# Patient Record
Sex: Female | Born: 1965 | Race: Black or African American | Hispanic: No | Marital: Single | Smoking: Former smoker
Health system: Southern US, Community
[De-identification: ages and names within clinical notes are randomized; demographics above are authoritative.]

## PROBLEM LIST (undated history)

## (undated) DIAGNOSIS — D259 Leiomyoma of uterus, unspecified: Secondary | ICD-10-CM

## (undated) DIAGNOSIS — N938 Other specified abnormal uterine and vaginal bleeding: Secondary | ICD-10-CM

## (undated) DIAGNOSIS — N83202 Unspecified ovarian cyst, left side: Secondary | ICD-10-CM

## (undated) DIAGNOSIS — N83201 Unspecified ovarian cyst, right side: Secondary | ICD-10-CM

## (undated) HISTORY — DX: Other specified abnormal uterine and vaginal bleeding: N93.8

## (undated) HISTORY — DX: Leiomyoma of uterus, unspecified: D25.9

## (undated) HISTORY — DX: Unspecified ovarian cyst, right side: N83.202

## (undated) HISTORY — DX: Unspecified ovarian cyst, right side: N83.201

---

## 1999-01-14 HISTORY — PX: TUBAL LIGATION: SHX77

## 2006-05-28 ENCOUNTER — Ambulatory Visit: Payer: Self-pay | Admitting: Family Medicine

## 2006-07-28 ENCOUNTER — Ambulatory Visit (HOSPITAL_COMMUNITY): Admission: RE | Admit: 2006-07-28 | Discharge: 2006-07-28 | Payer: Self-pay | Admitting: Internal Medicine

## 2006-08-25 ENCOUNTER — Emergency Department (HOSPITAL_COMMUNITY): Admission: EM | Admit: 2006-08-25 | Discharge: 2006-08-25 | Payer: Self-pay | Admitting: Emergency Medicine

## 2007-03-02 ENCOUNTER — Encounter: Admission: RE | Admit: 2007-03-02 | Discharge: 2007-03-02 | Payer: Self-pay | Admitting: Internal Medicine

## 2007-03-30 ENCOUNTER — Encounter: Admission: RE | Admit: 2007-03-30 | Discharge: 2007-03-30 | Payer: Self-pay | Admitting: Internal Medicine

## 2007-09-09 ENCOUNTER — Emergency Department (HOSPITAL_COMMUNITY): Admission: EM | Admit: 2007-09-09 | Discharge: 2007-09-09 | Payer: Self-pay | Admitting: Emergency Medicine

## 2008-03-31 ENCOUNTER — Encounter: Admission: RE | Admit: 2008-03-31 | Discharge: 2008-03-31 | Payer: Self-pay | Admitting: Internal Medicine

## 2008-10-31 ENCOUNTER — Ambulatory Visit (HOSPITAL_COMMUNITY): Admission: RE | Admit: 2008-10-31 | Discharge: 2008-10-31 | Payer: Self-pay | Admitting: Obstetrics

## 2009-03-22 ENCOUNTER — Emergency Department (HOSPITAL_COMMUNITY): Admission: EM | Admit: 2009-03-22 | Discharge: 2009-03-22 | Payer: Self-pay | Admitting: Emergency Medicine

## 2009-07-29 ENCOUNTER — Emergency Department (HOSPITAL_COMMUNITY): Admission: EM | Admit: 2009-07-29 | Discharge: 2009-07-29 | Payer: Self-pay | Admitting: Emergency Medicine

## 2009-10-24 ENCOUNTER — Ambulatory Visit: Payer: Self-pay | Admitting: Obstetrics & Gynecology

## 2009-10-24 ENCOUNTER — Other Ambulatory Visit: Admission: RE | Admit: 2009-10-24 | Discharge: 2009-10-24 | Payer: Self-pay | Admitting: Obstetrics & Gynecology

## 2009-10-24 LAB — CONVERTED CEMR LAB
MCHC: 34.8 g/dL (ref 30.0–36.0)
MCV: 76.1 fL — ABNORMAL LOW (ref 78.0–100.0)
Platelets: 137 10*3/uL — ABNORMAL LOW (ref 150–400)
TSH: 2.427 microintl units/mL (ref 0.350–4.500)

## 2009-10-29 ENCOUNTER — Ambulatory Visit (HOSPITAL_COMMUNITY): Admission: RE | Admit: 2009-10-29 | Discharge: 2009-10-29 | Payer: Self-pay | Admitting: Family Medicine

## 2010-03-28 LAB — POCT PREGNANCY, URINE: Preg Test, Ur: NEGATIVE

## 2010-03-30 LAB — WET PREP, GENITAL
Clue Cells Wet Prep HPF POC: NONE SEEN
Trich, Wet Prep: NONE SEEN

## 2010-03-30 LAB — URINALYSIS, ROUTINE W REFLEX MICROSCOPIC
Bilirubin Urine: NEGATIVE
Ketones, ur: NEGATIVE mg/dL
Protein, ur: NEGATIVE mg/dL
Urobilinogen, UA: 0.2 mg/dL (ref 0.0–1.0)

## 2010-03-30 LAB — POCT PREGNANCY, URINE: Preg Test, Ur: NEGATIVE

## 2010-04-16 ENCOUNTER — Other Ambulatory Visit (HOSPITAL_COMMUNITY): Payer: Self-pay | Admitting: Internal Medicine

## 2010-04-16 DIAGNOSIS — Z1231 Encounter for screening mammogram for malignant neoplasm of breast: Secondary | ICD-10-CM

## 2010-04-26 ENCOUNTER — Ambulatory Visit (HOSPITAL_COMMUNITY)
Admission: RE | Admit: 2010-04-26 | Discharge: 2010-04-26 | Disposition: A | Discharge status: Home / Self Care | Payer: Self-pay | Source: Ambulatory Visit | Attending: Internal Medicine | Admitting: Internal Medicine

## 2010-04-26 DIAGNOSIS — Z1231 Encounter for screening mammogram for malignant neoplasm of breast: Secondary | ICD-10-CM

## 2010-09-14 ENCOUNTER — Emergency Department (HOSPITAL_COMMUNITY)
Admission: EM | Admit: 2010-09-14 | Discharge: 2010-09-14 | Disposition: A | Discharge status: Home / Self Care | Payer: Self-pay | Attending: Emergency Medicine | Admitting: Emergency Medicine

## 2010-09-14 ENCOUNTER — Emergency Department (HOSPITAL_COMMUNITY): Payer: Self-pay

## 2010-09-14 DIAGNOSIS — R51 Headache: Secondary | ICD-10-CM | POA: Insufficient documentation

## 2010-09-14 DIAGNOSIS — H9209 Otalgia, unspecified ear: Secondary | ICD-10-CM | POA: Insufficient documentation

## 2010-11-27 ENCOUNTER — Encounter: Payer: Self-pay | Admitting: *Deleted

## 2010-11-29 ENCOUNTER — Ambulatory Visit: Payer: Self-pay | Admitting: Advanced Practice Midwife

## 2011-01-02 ENCOUNTER — Ambulatory Visit: Payer: Self-pay | Admitting: Advanced Practice Midwife

## 2011-01-29 ENCOUNTER — Encounter: Payer: Self-pay | Admitting: Advanced Practice Midwife

## 2011-01-29 ENCOUNTER — Ambulatory Visit (INDEPENDENT_AMBULATORY_CARE_PROVIDER_SITE_OTHER): Payer: Self-pay | Admitting: Advanced Practice Midwife

## 2011-01-29 VITALS — BP 143/97 | HR 65 | Temp 97.3°F | Resp 20 | Ht 66.0 in | Wt 172.3 lb

## 2011-01-29 DIAGNOSIS — I10 Essential (primary) hypertension: Secondary | ICD-10-CM

## 2011-01-29 DIAGNOSIS — Z01419 Encounter for gynecological examination (general) (routine) without abnormal findings: Secondary | ICD-10-CM

## 2011-01-29 DIAGNOSIS — Z23 Encounter for immunization: Secondary | ICD-10-CM

## 2011-01-29 MED ORDER — INFLUENZA VIRUS VACC SPLIT PF IM SUSP
0.5000 mL | INTRAMUSCULAR | Status: AC | PRN
  Administered 2011-01-29: 0.5 mL via INTRAMUSCULAR

## 2011-01-29 MED ORDER — HYDROCHLOROTHIAZIDE 12.5 MG PO TABS: 25.0000 mg | ORAL_TABLET | Freq: Every day | ORAL | Status: DC

## 2011-01-29 NOTE — Progress Notes (Signed)
  Subjective:     Erin Mcdaniel is a 46 y.o. female and is here for a comprehensive physical exam. The patient reports occasional headaches and high blood pressure readings.  .  History   Social History  . Marital Status: Single    Spouse Name: N/A    Number of Children: N/A  . Years of Education: N/A   Occupational History  . Not on file.   Social History Main Topics  . Smoking status: Former Smoker    Types: Cigarettes    Quit date: 01/28/1997  . Smokeless tobacco: Not on file  . Alcohol Use: Yes     social  . Drug Use: No  . Sexually Active: No   Other Topics Concern  . Not on file   Social History Narrative  . No narrative on file   No health maintenance topics applied.  The following portions of the patient's history were reviewed and updated as appropriate: allergies, current medications, past medical history, past social history, past surgical history and problem list.  Review of Systems Constitutional: negative for chills, fatigue and fevers Respiratory: negative for cough and shortness of breath Cardiovascular: negative for chest pain, chest pressure/discomfort and claudication Gastrointestinal: negative for abdominal pain, constipation, diarrhea, melena and vomiting Genitourinary:negative for dysuria, frequency and hematuria Neurological: negative for coordination problems and dizziness.  Positive for headaches.     Objective:    BP 143/97  Pulse 65  Temp(Src) 97.3 F (36.3 C) (Oral)  Resp 20  Ht 5\' 6"  (1.676 m)  Wt 172 lb 4.8 oz (78.155 kg)  BMI 27.81 kg/m2  LMP 01/13/2011 General appearance: alert, cooperative and no distress Head: Normocephalic, without obvious abnormality, atraumatic Neck: no adenopathy, supple, symmetrical, trachea midline and thyroid not enlarged, symmetric, no tenderness/mass/nodules Lungs: clear to auscultation bilaterally Breasts: normal appearance, no masses or tenderness Heart: regular rate and rhythm, S1, S2 normal, no  murmur, click, rub or gallop Abdomen: soft, non-tender; bowel sounds normal; no masses,  no organomegaly Pelvic: cervix normal in appearance, external genitalia normal, no adnexal masses or tenderness, no cervical motion tenderness, uterus normal size, shape, and consistency and vagina normal without discharge Pulses: 2+ and symmetric     Assessment:    Healthy female exam.  Elevated blood pressure     Plan:    1) Elevated blood pressure noted on vital signs.  Recommended an appointment with [facility].  Rx given to the patient for HCTZ and education provided on diet and exercise.    See After Visit Summary for Counseling Recommendations

## 2011-01-29 NOTE — Patient Instructions (Addendum)
Hypertension As your heart beats, it forces blood through your arteries. This force is your blood pressure. If the pressure is too high, it is called hypertension (HTN) or high blood pressure. HTN is dangerous because you may have it and not know it. High blood pressure may mean that your heart has to work harder to pump blood. Your arteries may be narrow or stiff. The extra work puts you at risk for heart disease, stroke, and other problems.  Blood pressure consists of two numbers, a higher number over a lower, 110/72, for example. It is stated as "110 over 72." The ideal is below 120 for the top number (systolic) and under 80 for the bottom (diastolic). Write down your blood pressure today. You should pay close attention to your blood pressure if you have certain conditions such as:  Heart failure.   Prior heart attack.   Diabetes   Chronic kidney disease.   Prior stroke.   Multiple risk factors for heart disease.  To see if you have HTN, your blood pressure should be measured while you are seated with your arm held at the level of the heart. It should be measured at least twice. A one-time elevated blood pressure reading (especially in the Emergency Department) does not mean that you need treatment. There may be conditions in which the blood pressure is different between your right and left arms. It is important to see your caregiver soon for a recheck. Most people have essential hypertension which means that there is not a specific cause. This type of high blood pressure may be lowered by changing lifestyle factors such as:  Stress.   Smoking.   Lack of exercise.   Excessive weight.   Drug/tobacco/alcohol use.   Eating less salt.  Most people do not have symptoms from high blood pressure until it has caused damage to the body. Effective treatment can often prevent, delay or reduce that damage. TREATMENT  When a cause has been identified, treatment for high blood pressure is  directed at the cause. There are a large number of medications to treat HTN. These fall into several categories, and your caregiver will help you select the medicines that are best for you. Medications may have side effects. You should review side effects with your caregiver. If your blood pressure stays high after you have made lifestyle changes or started on medicines,   Your medication(s) may need to be changed.   Other problems may need to be addressed.   Be certain you understand your prescriptions, and know how and when to take your medicine.   Be sure to follow up with your caregiver within the time frame advised (usually within two weeks) to have your blood pressure rechecked and to review your medications.   If you are taking more than one medicine to lower your blood pressure, make sure you know how and at what times they should be taken. Taking two medicines at the same time can result in blood pressure that is too low.  SEEK IMMEDIATE MEDICAL CARE IF:  You develop a severe headache, blurred or changing vision, or confusion.   You have unusual weakness or numbness, or a faint feeling.   You have severe chest or abdominal pain, vomiting, or breathing problems.  MAKE SURE YOU:   Understand these instructions.   Will watch your condition.   Will get help right away if you are not doing well or get worse.  Document Released: 12/30/2004 Document Revised: 09/11/2010 Document Reviewed:  08/20/2007 [employer] Patient Information 2012 [place], [place].Place annual gynecologic exam patient instructions here.   Health Maintenance, Females A healthy lifestyle and preventative care can promote health and wellness.  Maintain regular health, dental, and eye exams.   Eat a healthy diet. Foods like vegetables, fruits, whole grains, low-fat dairy products, and lean protein foods contain the nutrients you need without too many calories. Decrease your intake of foods high in solid fats, added  sugars, and salt. Get information about a proper diet from your caregiver, if necessary.   Regular physical exercise is one of the most important things you can do for your health. Most adults should get at least 150 minutes of moderate-intensity exercise (any activity that increases your heart rate and causes you to sweat) each week. In addition, most adults need muscle-strengthening exercises on 2 or more days a week.    Maintain a healthy weight. The body mass index (BMI) is a screening tool to identify possible weight problems. It provides an estimate of body fat based on height and weight. Your caregiver can help determine your BMI, and can help you achieve or maintain a healthy weight. For adults 20 years and older:   A BMI below 18.5 is considered underweight.   A BMI of 18.5 to 24.9 is normal.   A BMI of 25 to 29.9 is considered overweight.   A BMI of 30 and above is considered obese.   Maintain normal blood lipids and cholesterol by exercising and minimizing your intake of saturated fat. Eat a balanced diet with plenty of fruits and vegetables. Blood tests for lipids and cholesterol should begin at age 87 and be repeated every 5 years. If your lipid or cholesterol levels are high, you are over 50, or you are a high risk for heart disease, you may need your cholesterol levels checked more frequently.Ongoing high lipid and cholesterol levels should be treated with medicines if diet and exercise are not effective.   If you smoke, find out from your caregiver how to quit. If you do not use tobacco, do not start.   If you are pregnant, do not drink alcohol. If you are breastfeeding, be very cautious about drinking alcohol. If you are not pregnant and choose to drink alcohol, do not exceed 1 drink per day. One drink is considered to be 12 ounces (355 mL) of beer, 5 ounces (148 mL) of wine, or 1.5 ounces (44 mL) of liquor.   Avoid use of street drugs. Do not share needles with anyone. Ask for  help if you need support or instructions about stopping the use of drugs.   High blood pressure causes heart disease and increases the risk of stroke. Blood pressure should be checked at least every 1 to 2 years. Ongoing high blood pressure should be treated with medicines, if weight loss and exercise are not effective.   If you are 52 to 44 years old, ask your caregiver if you should take aspirin to prevent strokes.   Diabetes screening involves taking a blood sample to check your fasting blood sugar level. This should be done once every 3 years, after age 47, if you are within normal weight and without risk factors for diabetes. Testing should be considered at a younger age or be carried out more frequently if you are overweight and have at least 1 risk factor for diabetes.   Breast cancer screening is essential preventative care for women. You should practice "breast self-awareness." This means understanding the normal appearance  and feel of your breasts and may include breast self-examination. Any changes detected, no matter how small, should be reported to a caregiver. Women in their 31s and 30s should have a clinical breast exam (CBE) by a caregiver as part of a regular health exam every 1 to 3 years. After age 74, women should have a CBE every year. Starting at age 57, women should consider having a mammogram (breast X-ray) every year. Women who have a family history of breast cancer should talk to their caregiver about genetic screening. Women at a high risk of breast cancer should talk to their caregiver about having an MRI and a mammogram every year.   The Pap test is a screening test for cervical cancer. Women should have a Pap test starting at age 6. Between ages 108 and 61, Pap tests should be repeated every 2 years. Beginning at age 46, you should have a Pap test every 3 years as long as the past 3 Pap tests have been normal. If you had a hysterectomy for a problem that was not cancer or a  condition that could lead to cancer, then you no longer need Pap tests. If you are between ages 60 and 72, and you have had normal Pap tests going back 10 years, you no longer need Pap tests. If you have had past treatment for cervical cancer or a condition that could lead to cancer, you need Pap tests and screening for cancer for at least 20 years after your treatment. If Pap tests have been discontinued, risk factors (such as a new sexual partner) need to be reassessed to determine if screening should be resumed. Some women have medical problems that increase the chance of getting cervical cancer. In these cases, your caregiver may recommend more frequent screening and Pap tests.   The human papillomavirus (HPV) test is an additional test that may be used for cervical cancer screening. The HPV test looks for the virus that can cause the cell changes on the cervix. The cells collected during the Pap test can be tested for HPV. The HPV test could be used to screen women aged 38 years and older, and should be used in women of any age who have unclear Pap test results. After the age of 24, women should have HPV testing at the same frequency as a Pap test.   Colorectal cancer can be detected and often prevented. Most routine colorectal cancer screening begins at the age of 31 and continues through age 43. However, your caregiver may recommend screening at an earlier age if you have risk factors for colon cancer. On a yearly basis, your caregiver may provide home test kits to check for hidden blood in the stool. Use of a small camera at the end of a tube, to directly examine the colon (sigmoidoscopy or colonoscopy), can detect the earliest forms of colorectal cancer. Talk to your caregiver about this at age 48, when routine screening begins. Direct examination of the colon should be repeated every 5 to 10 years through age 63, unless early forms of pre-cancerous polyps or small growths are found.   Practice safe  sex. Use condoms and avoid high-risk sexual practices to reduce the spread of sexually transmitted infections (STIs). Sexually active women aged 74 and younger should be checked for Chlamydia, which is a common sexually transmitted infection. Older women with new or multiple partners should also be tested for Chlamydia. Testing for other STIs is recommended if you are sexually active and  at increased risk.   Osteoporosis is a disease in which the bones lose minerals and strength with aging. This can result in serious bone fractures. The risk of osteoporosis can be identified using a bone density scan. Women ages 28 and over and women at risk for fractures or osteoporosis should discuss screening with their caregivers. Ask your caregiver whether you should be taking a calcium supplement or vitamin D to reduce the rate of osteoporosis.   Menopause can be associated with physical symptoms and risks. Hormone replacement therapy is available to decrease symptoms and risks. You should talk to your caregiver about whether hormone replacement therapy is right for you.   Use sunscreen with a sun protection factor (SPF) of 30 or greater. Apply sunscreen liberally and repeatedly throughout the day. You should seek shade when your shadow is shorter than you. Protect yourself by wearing long sleeves, pants, a wide-brimmed hat, and sunglasses year round, whenever you are outdoors.   Notify your caregiver of new moles or changes in moles, especially if there is a change in shape or color. Also notify your caregiver if a mole is larger than the size of a pencil eraser.   Stay current with your immunizations.  Document Released: 07/15/2010 Document Revised: 09/11/2010 Document Reviewed: 07/15/2010 [facility] Patient Information 2012 [place], [place].

## 2011-01-30 ENCOUNTER — Telehealth: Payer: Self-pay | Admitting: *Deleted

## 2011-01-30 NOTE — Telephone Encounter (Signed)
I returned pt call and explained that she will need to call [facility] and go through their qualification process. I gave her the number to call. Pt voiced understanding.

## 2011-01-30 NOTE — Telephone Encounter (Signed)
Pt left message stating that she was told by the doctor that she would get a referral to [facility]. She did not get the referral before leaving clinic. Please call

## 2011-03-04 ENCOUNTER — Other Ambulatory Visit (HOSPITAL_COMMUNITY): Payer: Self-pay | Admitting: Internal Medicine

## 2011-03-04 DIAGNOSIS — Z1231 Encounter for screening mammogram for malignant neoplasm of breast: Secondary | ICD-10-CM

## 2011-03-28 ENCOUNTER — Emergency Department (HOSPITAL_COMMUNITY): Payer: Self-pay

## 2011-03-28 ENCOUNTER — Emergency Department (HOSPITAL_COMMUNITY)
Admission: EM | Admit: 2011-03-28 | Discharge: 2011-03-28 | Disposition: A | Discharge status: Home / Self Care | Payer: Self-pay | Attending: Emergency Medicine | Admitting: Emergency Medicine

## 2011-03-28 ENCOUNTER — Encounter (HOSPITAL_COMMUNITY): Payer: Self-pay | Admitting: Emergency Medicine

## 2011-03-28 DIAGNOSIS — X58XXXA Exposure to other specified factors, initial encounter: Secondary | ICD-10-CM | POA: Insufficient documentation

## 2011-03-28 DIAGNOSIS — S90859A Superficial foreign body, unspecified foot, initial encounter: Secondary | ICD-10-CM

## 2011-03-28 DIAGNOSIS — M79609 Pain in unspecified limb: Secondary | ICD-10-CM | POA: Insufficient documentation

## 2011-03-28 DIAGNOSIS — IMO0002 Reserved for concepts with insufficient information to code with codable children: Secondary | ICD-10-CM | POA: Insufficient documentation

## 2011-03-28 MED ORDER — CEPHALEXIN 500 MG PO CAPS: 500.0000 mg | ORAL_CAPSULE | Freq: Four times a day (QID) | ORAL | Status: DC

## 2011-03-28 MED ORDER — TETANUS-DIPHTH-ACELL PERTUSSIS 5-2.5-18.5 LF-MCG/0.5 IM SUSP
0.5000 mL | Freq: Once | INTRAMUSCULAR | Status: AC
  Administered 2011-03-28: 0.5 mL via INTRAMUSCULAR
  Filled 2011-03-28: qty 0.5

## 2011-03-28 NOTE — ED Provider Notes (Signed)
Medical screening examination/treatment/procedure(s) were performed by non-physician practitioner and as supervising physician I was immediately available for consultation/collaboration.   Morrell Fluke, MD 03/28/11 2259 

## 2011-03-28 NOTE — Progress Notes (Signed)
ED CM contacted by ED PA/NP to assist with medications but pt states she is able to afford $4 keflex from [facility].  Pt inquired about assistance for funding with ortho appt.  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  Pt informed that [facility] does not have funding to assist with copay or bill for ortho appt. Pt referred to the billing office of the referred orthopedic provider for assistance.  Pt voiced understanding.

## 2011-03-28 NOTE — ED Provider Notes (Signed)
History     CSN: [id number]  Arrival date & time 03/28/11  1549   First MD Initiated Contact with Patient 03/28/11 1610      Chief Complaint  Patient presents with  . Foot Problem    wound to bottom of LT foot x 2days.    (Consider location/radiation/quality/duration/timing/severity/associated sxs/prior treatment) HPI Comments: Patient presents emergency Department chief complain of left foot pain.  Patient states that 2 days ago she may have stepped on something in that she has a wound on the bottom of her left foot. She reports mild pain with weight bearing & some clear draining from wound. She denies fevers, night sweats, chills, mucous drainage, surrounding erythema, streaking up her leg, or extremity edema.   The history is provided by the patient.    Past Medical History  Diagnosis Date  . DUB (dysfunctional uterine bleeding)   . Bilateral ovarian cysts   . Fibroid, uterus     Past Surgical History  Procedure Date  . Tubal ligation 2001    Family History  Problem Relation Age of Onset  . Diabetes Mother   . Heart disease Mother   . Hypertension Mother   . Cancer Maternal Aunt   . Hyperthyroidism Brother   . Hypertension Brother     History  Substance Use Topics  . Smoking status: Former Smoker    Types: Cigarettes    Quit date: 01/28/1997  . Smokeless tobacco: Not on file  . Alcohol Use: Yes     social    OB History    Grav Para Term Preterm Abortions TAB SAB Ect Mult Living   2 2 2       2       Review of Systems  Constitutional: Negative for fever, chills and appetite change.  HENT: Negative for congestion.   Eyes: Negative for visual disturbance.  Respiratory: Negative for shortness of breath.   Cardiovascular: Negative for chest pain and leg swelling.  Gastrointestinal: Negative for abdominal pain.  Genitourinary: Negative for dysuria, urgency and frequency.  Skin: Positive for wound. Negative for color change and rash.  Neurological:  Negative for dizziness, syncope, weakness, light-headedness, numbness and headaches.  Psychiatric/Behavioral: Negative for confusion.  All other systems reviewed and are negative.    Allergies  Review of patient's allergies indicates no known allergies.  Home Medications   Current Outpatient Rx  Name Route Sig Dispense Refill  . IBUPROFEN 200 MG PO TABS Oral Take 200 mg by mouth every 6 (six) hours as needed. pain      BP 129/91  Pulse 64  Temp 98.7 F (37.1 C)  Resp 18  Ht 5\' 6"  (1.676 m)  Wt 170 lb (77.111 kg)  BMI 27.44 kg/m2  SpO2 100%  LMP 03/28/2011  Physical Exam  Nursing note and vitals reviewed. Constitutional: She is oriented to person, place, and time. She appears well-developed and well-nourished. No distress.  HENT:  Head: Normocephalic and atraumatic.  Eyes: Conjunctivae and EOM are normal.  Neck: Normal range of motion.  Pulmonary/Chest: Effort normal.  Musculoskeletal: Normal range of motion.       Left foot: She exhibits tenderness. She exhibits no bony tenderness, no swelling and normal capillary refill.       Small puncture-type wound located on the plantar surface of patient's left foot.  Wound is not currently draining, no surrounding erythema, and no swelling to the area.  Mild tenderness on palpation.  The patient is able to and really without difficulty.  Neurological: She is alert and oriented to person, place, and time.  Skin: Skin is warm and dry. No rash noted. She is not diaphoretic.  Psychiatric: She has a normal mood and affect. Her behavior is normal.    ED Course  Procedures (including critical care time)  Labs Reviewed - No data to display Dg Foot Complete Left  03/28/2011  *RADIOLOGY REPORT*  Clinical Data: Nonhealing puncture wound involving the plantar surface of the foot.  History of stepping on something 2 months previously.  LEFT FOOT - COMPLETE 3+ VIEW  Comparison: None.  Findings: There is a small opaque density projecting  between the distal diaphyses of the fourth and fifth metatarsals on the oblique view and between the third and fourth metatarsals on the frontal view.  This may reflect a small opaque foreign body within the soft tissues.  The density cannot be definitely visualized on the lateral image.  No fracture or bony destruction is seen.  There is minimal spurring of the distal dorsal aspect of the first metatarsal.  There is calcification in the distal Achilles tendon area.  IMPRESSION: Small opaque density projecting between the distal diaphyses of the fourth and fifth metatarsals on the oblique view and between the third and fourth metatarsals on the frontal view.  This may reflect a small opaque foreign body within the soft tissues.  The density cannot be definitely visualized on the lateral image.  Original Report Authenticated By: Crawford Givens, M.D.     No diagnosis found.    MDM  Foot pain, question foreign body  Tdap booster given.  No bone or joint penetration, & possible foreign body present, but can not be seen or extracted on exam- unable to determine depth of ?FB on xray. Local care discussed and abx prescribed. Pt is to call orthopedics Monday morning to schedule a f-u apt. Note there is no evidence of infection at this time. Pt has been advised that left foot should be wt bearing as tolerated, elevated when possible & followed very closely for signs of infection. Pt verbalizes understanding and is agreeable with plan. Case discussed w Dr. Ranae Palms who agrees w above plan.          Jaci Carrel, [place] 03/28/11 1812

## 2011-03-28 NOTE — Discharge Instructions (Signed)
Call the orthopedic office first thing Monday morning for further evaluation of your foot.  Take your full course of antibiotics as prescribed.  Be sure to clean your wound 2 times daily with soap and water.  Keep the extremity elevated when resting.

## 2011-03-28 NOTE — ED Notes (Signed)
Pt with small ulceration (less than pencil eraser) to left base of foot, had "pus and blood" that "came out of it", now covered by pt in bandaid. Not sure of last tetanus shot. Not sure of injury or not.

## 2011-04-08 ENCOUNTER — Encounter (HOSPITAL_COMMUNITY): Payer: Self-pay | Admitting: *Deleted

## 2011-04-08 ENCOUNTER — Emergency Department (HOSPITAL_COMMUNITY)
Admission: EM | Admit: 2011-04-08 | Discharge: 2011-04-08 | Disposition: A | Discharge status: Home / Self Care | Payer: Self-pay | Attending: Emergency Medicine | Admitting: Emergency Medicine

## 2011-04-08 DIAGNOSIS — M795 Residual foreign body in soft tissue: Secondary | ICD-10-CM | POA: Insufficient documentation

## 2011-04-08 MED ORDER — BACITRACIN-NEOMYCIN-POLYMYXIN OINTMENT TUBE
TOPICAL_OINTMENT | Freq: Once | CUTANEOUS | Status: DC
  Filled 2011-04-08: qty 15

## 2011-04-08 MED ORDER — BUPIVACAINE HCL (PF) 0.5 % IJ SOLN
10.0000 mL | Freq: Once | INTRAMUSCULAR | Status: DC
  Filled 2011-04-08: qty 30

## 2011-04-08 MED ORDER — SODIUM BICARBONATE 8.4 % IV SOLN
50.0000 meq | Freq: Once | INTRAVENOUS | Status: DC
  Filled 2011-04-08: qty 50

## 2011-04-08 NOTE — ED Notes (Signed)
Pt sts was seen here two weeks ago for small ulcer like sore on sole of L foot. Xray showed a foreign object, unable to tell what. Pt sts she began trying to get object out, saw that it was glass. Unable to remove object, made pain worse.

## 2011-04-08 NOTE — Discharge Instructions (Signed)
Foreign Body When the skin is cut or punctured and some object is left in the tissue under the skin, that object is called a "foreign body". A foreign body could be a wood splinter, a thorn, a sliver of metal, a shard of glass, a cactus needle or the tip of a pencil. In most instances, your caregiver will recommend that the foreign body be removed. If it is not removed, infection, abscess formation, an allergic reaction, chronic pain and disability can occur over time. Sometimes, foreign bodies (particularly very small ones) can be difficult to locate. Your caregiver may recommend x-rays or ultrasound imaging to help find them. If removal is not successful, there may be a need to see a surgeon who might suggest further exploration in the operating room. Occasionally, tiny bits of metallic foreign material (such as shrapnel) are not removed, if it is felt that there would be no harm in leaving them untouched. HOME CARE INSTRUCTIONS  Rest the injured area and keep it elevated until all the pain and swelling are gone.   You will need a tetanus vaccination if you have not had one in the last 5 years.   Return to this facility, see your caregiver or follow-up as instructed in 2 days.  SEEK IMMEDIATE MEDICAL CARE IF:   You develop increasing redness or swelling of the skin near the wound.   You develop drainage of pus from the wound.   You have persistent pain or loss of motion.   You have red streaks extending above or below the wound location.  MAKE SURE YOU:   Understand these instructions.   Will watch your condition.   Will get help right away if you are not doing well or get worse.  Document Released: 12/30/2004 Document Revised: 12/19/2010 Document Reviewed: 12/01/2008 [facility] Patient Information 2012 [place], [place]. Discussed a small piece of glass was removed from your foot I would like you to soak her foot, 2 or 3 times a day for the next 2 days in a Epsom salts or warm salt water  bath

## 2011-04-08 NOTE — ED Provider Notes (Signed)
History     CSN: [id number]  Arrival date & time 04/08/11  1600   First MD Initiated Contact with Patient 04/08/11 1837      Chief Complaint  Patient presents with  . Foot Injury    (Consider location/radiation/quality/duration/timing/severity/associated sxs/prior treatment) HPI Comments: Patient was seen 2 weeks ago for foreign body or left.  She was placed on antibiotics.  It is persistently gotten worse and pain.  No inflammation.  No drainage.  She has been digging at it with a pin now.  It is more painful  Patient is a 46 y.o. female presenting with foot injury.  Foot Injury  The incident occurred more than 1 week ago.    Past Medical History  Diagnosis Date  . DUB (dysfunctional uterine bleeding)   . Bilateral ovarian cysts   . Fibroid, uterus     Past Surgical History  Procedure Date  . Tubal ligation 2001    Family History  Problem Relation Age of Onset  . Diabetes Mother   . Heart disease Mother   . Hypertension Mother   . Cancer Maternal Aunt   . Hyperthyroidism Brother   . Hypertension Brother     History  Substance Use Topics  . Smoking status: Former Smoker    Types: Cigarettes    Quit date: 01/28/1997  . Smokeless tobacco: Not on file  . Alcohol Use: Yes     social    OB History    Grav Para Term Preterm Abortions TAB SAB Ect Mult Living   2 2 2       2       Review of Systems  Skin: Positive for wound.    Allergies  Review of patient's allergies indicates no known allergies.  Home Medications   Current Outpatient Rx  Name Route Sig Dispense Refill  . IBUPROFEN 200 MG PO TABS Oral Take 600 mg by mouth every 8 (eight) hours as needed. pain    . CEPHALEXIN 500 MG PO CAPS Oral Take 500 mg by mouth 4 (four) times daily.      BP 137/87  Pulse 84  Temp(Src) 98.5 F (36.9 C) (Oral)  Resp 18  SpO2 99%  LMP 03/28/2011  Physical Exam  Constitutional: She is oriented to person, place, and time. She appears well-developed and  well-nourished.  HENT:  Head: Normocephalic.  Neck: Normal range of motion.  Cardiovascular: Normal rate.   Pulmonary/Chest: Effort normal.  Musculoskeletal:       Are muddy and bottom of the left foot, between the fourth and fifth metatarsals  Neurological: She is alert and oriented to person, place, and time.  Skin:       No inflammatory process around puncture site    ED Course  FOREIGN BODY REMOVAL Performed by: Arman Filter Authorized by: Earley Favor K FOREIGN BODY REMOVAL Date/Time: 04/08/2011 7:56 PM Performed by: Arman Filter Authorized by: Arman Filter Consent: Verbal consent obtained. Risks and benefits: risks, benefits and alternatives were discussed Consent given by: patient Required items: required blood products, implants, devices, and special equipment available Patient identity confirmed: verbally with patient Body area: skin Anesthesia: local infiltration Local anesthetic: bupivacaine 0.5% without epinephrine Anesthetic total: 2 ml Patient sedated: no Patient restrained: no Localization method: visualized Dressing: antibiotic ointment Tendon involvement: none Depth: deep Complexity: simple 1 objects recovered. Post-procedure assessment: foreign body removed Patient tolerance: Patient tolerated the procedure well with no immediate complications.   (including critical care time)  Labs Reviewed -  No data to display No results found.   1. Foreign body (FB) in soft tissue       MDM  Foreign body, right foot        Arman Filter, NP 04/08/11 2000  Arman Filter, NP 04/08/11 2000

## 2011-04-08 NOTE — ED Provider Notes (Signed)
Medical screening examination/treatment/procedure(s) were performed by non-physician practitioner and as supervising physician I was immediately available for consultation/collaboration.  Raeford Razor, MD 04/08/11 [phone]

## 2011-04-28 ENCOUNTER — Ambulatory Visit (HOSPITAL_COMMUNITY)
Admission: RE | Admit: 2011-04-28 | Discharge: 2011-04-28 | Disposition: A | Discharge status: Home / Self Care | Payer: Self-pay | Source: Ambulatory Visit | Attending: Internal Medicine | Admitting: Internal Medicine

## 2011-04-28 ENCOUNTER — Ambulatory Visit (HOSPITAL_COMMUNITY): Payer: Self-pay

## 2011-04-28 DIAGNOSIS — Z1231 Encounter for screening mammogram for malignant neoplasm of breast: Secondary | ICD-10-CM

## 2011-05-07 ENCOUNTER — Emergency Department (HOSPITAL_COMMUNITY)
Admission: EM | Admit: 2011-05-07 | Discharge: 2011-05-07 | Disposition: A | Discharge status: Home / Self Care | Payer: Self-pay | Attending: Emergency Medicine | Admitting: Emergency Medicine

## 2011-05-07 ENCOUNTER — Encounter (HOSPITAL_COMMUNITY): Payer: Self-pay | Admitting: *Deleted

## 2011-05-07 ENCOUNTER — Emergency Department (HOSPITAL_COMMUNITY): Payer: Self-pay

## 2011-05-07 DIAGNOSIS — S91309A Unspecified open wound, unspecified foot, initial encounter: Secondary | ICD-10-CM

## 2011-05-07 DIAGNOSIS — M79609 Pain in unspecified limb: Secondary | ICD-10-CM | POA: Insufficient documentation

## 2011-05-07 NOTE — Discharge Instructions (Signed)
X-ray was negative for glass. Please use this special Band-Aids discussed upper bunions take pressure off the wound. Please call the wound care center for further evaluation.   Puncture Wound A puncture wound is an injury that extends through all layers of the skin and into the tissue beneath the skin (subcutaneous tissue). Puncture wounds become infected easily because germs often enter the body and go beneath the skin during the injury. Having a deep wound with a small entrance point makes it difficult for your caregiver to adequately clean the wound. This is especially true if you have stepped on a nail and it has passed through a dirty shoe or other situations where the wound is obviously contaminated. CAUSES  Many puncture wounds involve glass, nails, splinters, fish hooks, or other objects that enter the skin (foreign bodies). A puncture wound may also be caused by a human bite or animal bite. DIAGNOSIS  A puncture wound is usually diagnosed by your history and a physical exam. You may need to have an X-ray or an ultrasound to check for any foreign bodies still in the wound. TREATMENT   Your caregiver will clean the wound as thoroughly as possible. Depending on the location of the wound, a bandage (dressing) may be applied.   Your caregiver might prescribe antibiotic medicines.   You may need a follow-up visit to check on your wound. Follow all instructions as directed by your caregiver.  HOME CARE INSTRUCTIONS   Change your dressing once per day, or as directed by your caregiver. If the dressing sticks, it may be removed by soaking the area in water.   If your caregiver has given you follow-up instructions, it is very important that you return for a follow-up appointment. Not following up as directed could result in a chronic or permanent injury, pain, and disability.   Only take over-the-counter or prescription medicines for pain, discomfort, or fever as directed by your caregiver.    If you are given antibiotics, take them as directed. Finish them even if you start to feel better.  You may need a tetanus shot if:  You cannot remember when you had your last tetanus shot.   You have never had a tetanus shot.  If you got a tetanus shot, your arm may swell, get red, and feel warm to the touch. This is common and not a problem. If you need a tetanus shot and you choose not to have one, there is a rare chance of getting tetanus. Sickness from tetanus can be serious. You may need a rabies shot if an animal bite caused your puncture wound. SEEK MEDICAL CARE IF:   You have redness, swelling, or increasing pain in the wound.   You have red streaks going away from the wound.   You notice a bad smell coming from the wound or dressing.   You have yellowish-white fluid (pus) coming from the wound.   You are treated with an antibiotic for infection, but the infection is not getting better.   You notice something in the wound, such as rubber from your shoe, cloth, or another object.   You have a fever.   You have severe pain.   You have difficulty breathing.   You feel dizzy or faint.   You cannot stop vomiting.   You lose feeling, develop numbness, or cannot move a limb below the wound.   Your symptoms worsen.  MAKE SURE YOU:  Understand these instructions.   Will watch your condition.  Will get help right away if you are not doing well or get worse.  Document Released: 10/09/2004 Document Revised: 12/19/2010 Document Reviewed: 06/18/2010 [facility] Patient Information 2012 [place], [place].

## 2011-05-07 NOTE — ED Provider Notes (Signed)
History     CSN: [id number]  Arrival date & time 05/07/11  1903   First MD Initiated Contact with Patient 05/07/11 1912     7:51 PM HPI This reports approximately one month ago had glass in her left plantar foot. Reports area continues to hurt despite having glass removed. Reports area is failing to heal. Reports area will continue to drain minimal bloody drainage. Denies redness or purulent drainage. Reports no improvement warm compresses, antibiotics, rest. Denies fever or reinjury.  Patient is a 46 y.o. female presenting with lower extremity pain. The history is provided by the patient.  Foot Pain This is a chronic problem. The current episode started more than 1 month ago. The problem occurs constantly. The problem has been unchanged. Pertinent negatives include no chills, fever, joint swelling, numbness or weakness. The symptoms are aggravated by walking and standing. She has tried NSAIDs, heat and rest for the symptoms. The treatment provided no relief.    Past Medical History  Diagnosis Date  . DUB (dysfunctional uterine bleeding)   . Bilateral ovarian cysts   . Fibroid, uterus     Past Surgical History  Procedure Date  . Tubal ligation 2001    Family History  Problem Relation Age of Onset  . Diabetes Mother   . Heart disease Mother   . Hypertension Mother   . Cancer Maternal Aunt   . Hyperthyroidism Brother   . Hypertension Brother     History  Substance Use Topics  . Smoking status: Former Smoker    Types: Cigarettes    Quit date: 01/28/1997  . Smokeless tobacco: Not on file  . Alcohol Use: Yes     social    OB History    Grav Para Term Preterm Abortions TAB SAB Ect Mult Living   2 2 2       2       Review of Systems  Constitutional: Negative for fever and chills.  Eyes: Positive for pain.  Musculoskeletal: Negative for joint swelling.       Foot pain  Skin: Positive for wound.  Neurological: Negative for weakness and numbness.  All other systems  reviewed and are negative.    Allergies  Review of patient's allergies indicates no known allergies.  Home Medications   Current Outpatient Rx  Name Route Sig Dispense Refill  . ACETAMINOPHEN 500 MG PO TABS Oral Take 500 mg by mouth every 6 (six) hours as needed.    . IBUPROFEN 200 MG PO TABS Oral Take 600 mg by mouth every 8 (eight) hours as needed. pain    . BACITRACIN-NEOMYCIN-POLYMYXIN 400-05-4998 EX OINT Topical Apply topically every 12 (twelve) hours. apply to foot      BP 141/105  Pulse 73  Temp(Src) 98.4 F (36.9 C) (Oral)  Resp 20  SpO2 100%  LMP 03/29/2011  Physical Exam  Vitals reviewed. Constitutional: She is oriented to person, place, and time. Vital signs are normal. She appears well-developed and well-nourished. No distress.  HENT:  Head: Normocephalic and atraumatic.  Eyes: Pupils are equal, round, and reactive to light.  Neck: Neck supple.  Pulmonary/Chest: Effort normal.  Neurological: She is alert and oriented to person, place, and time.  Skin: Skin is warm and dry. No rash noted. No erythema. No pallor.       Plantar portion of the left foot under fourth distal MTP is a small puncture wound. Surrounding tissue. No abscess palpable. Bloody drainage but no purulent drainage. Mildly tender to palpation  Psychiatric: She has a normal mood and affect. Her behavior is normal.    ED Course  Procedures   Dg Foot Complete Left  05/07/2011  *RADIOLOGY REPORT*  Clinical Data: Recent removal of glass from wound in the left foot.  LEFT FOOT - COMPLETE 3+ VIEW  Comparison: Three views of the left foot to 03/28/2011.  Findings: The previously noted radiopaque foreign body in the left foot is no longer evident.  No acute bone or soft tissue abnormality is evident.  IMPRESSION: Negative left foot.  Original Report Authenticated By: Jamesetta Orleans. MATTERN, M.D.      MDM   Discussed normal x-ray with patient. Advised using donut shaped callous Band-Aids which can be  found over-the-counter to reduce pressure on to wound when walking. Advised if no improvement in 2 weeks to followup with wound care center which I have provided a referral for. Patient agrees to plan and is ready for discharge.       Thomasene Lot, PA-C 05/08/11 [phone]

## 2011-05-09 NOTE — ED Provider Notes (Signed)
Medical screening examination/treatment/procedure(s) were performed by non-physician practitioner and as supervising physician I was immediately available for consultation/collaboration.   Rolan Bucco, MD 05/09/11 [phone]

## 2011-05-15 ENCOUNTER — Other Ambulatory Visit: Payer: Self-pay

## 2011-05-15 ENCOUNTER — Emergency Department (HOSPITAL_COMMUNITY): Payer: Self-pay

## 2011-05-15 ENCOUNTER — Encounter (HOSPITAL_COMMUNITY): Payer: Self-pay | Admitting: *Deleted

## 2011-05-15 ENCOUNTER — Emergency Department (HOSPITAL_COMMUNITY)
Admission: EM | Admit: 2011-05-15 | Discharge: 2011-05-15 | Disposition: A | Discharge status: Home / Self Care | Payer: Self-pay | Attending: Emergency Medicine | Admitting: Emergency Medicine

## 2011-05-15 DIAGNOSIS — R071 Chest pain on breathing: Secondary | ICD-10-CM | POA: Insufficient documentation

## 2011-05-15 DIAGNOSIS — R0789 Other chest pain: Secondary | ICD-10-CM

## 2011-05-15 LAB — CBC
HCT: 36.7 % (ref 36.0–46.0)
Hemoglobin: 12.9 g/dL (ref 12.0–15.0)
MCH: 26.4 pg (ref 26.0–34.0)
MCHC: 35.1 g/dL (ref 30.0–36.0)
MCV: 75.2 fL — ABNORMAL LOW (ref 78.0–100.0)
Platelets: 148 10*3/uL — ABNORMAL LOW (ref 150–400)
RBC: 4.88 MIL/uL (ref 3.87–5.11)
RDW: 13.9 % (ref 11.5–15.5)
WBC: 4.9 10*3/uL (ref 4.0–10.5)

## 2011-05-15 LAB — POCT I-STAT TROPONIN I: Troponin i, poc: 0.03 ng/mL (ref 0.00–0.08)

## 2011-05-15 LAB — BASIC METABOLIC PANEL
BUN: 11 mg/dL (ref 6–23)
CO2: 32 mEq/L (ref 19–32)
Calcium: 9.1 mg/dL (ref 8.4–10.5)
Chloride: 104 mEq/L (ref 96–112)
Creatinine, Ser: 0.82 mg/dL (ref 0.50–1.10)
GFR calc Af Amer: >90 mL/min (ref >90)
GFR calc non Af Amer: 85 mL/min — ABNORMAL LOW (ref >90)
Glucose, Bld: 102 mg/dL — ABNORMAL HIGH (ref 70–99)
Potassium: 3.3 mEq/L — ABNORMAL LOW (ref 3.5–5.1)
Sodium: 142 mEq/L (ref 135–145)

## 2011-05-15 MED ORDER — IBUPROFEN 800 MG PO TABS: 800.0000 mg | ORAL_TABLET | Freq: Three times a day (TID) | ORAL | Status: AC | PRN

## 2011-05-15 MED ORDER — KETOROLAC TROMETHAMINE 60 MG/2ML IM SOLN: 60.0000 mg | Freq: Once | INTRAMUSCULAR | Status: DC

## 2011-05-15 NOTE — ED Provider Notes (Signed)
History     CSN: [id number]  Arrival date & time 05/15/11  1707   First MD Initiated Contact with Patient 05/15/11 1726      Chief Complaint  Patient presents with  . Chest Pain    (Consider location/radiation/quality/duration/timing/severity/associated sxs/prior treatment) HPI The patient presents with chest pain for the last 24 hours. She states that the pain comes and goes but is only there for seconds at a time. THe patient denies exertional symptoms. The patient states that certain positions make the pain worse. The patient states that she has been doing new exercises on her chest and arms. The patient denies SOB, weakness, dizziness, syncope, hemoptysis, N/V, abd pain, fever, cough, calf pain or leg swelling. The patient states that nothing seemed to help the pain. Past Medical History  Diagnosis Date  . DUB (dysfunctional uterine bleeding)   . Bilateral ovarian cysts   . Fibroid, uterus     Past Surgical History  Procedure Date  . Tubal ligation 2001    Family History  Problem Relation Age of Onset  . Diabetes Mother   . Heart disease Mother   . Hypertension Mother   . Cancer Maternal Aunt   . Hyperthyroidism Brother   . Hypertension Brother     History  Substance Use Topics  . Smoking status: Former Smoker    Types: Cigarettes    Quit date: 01/28/1997  . Smokeless tobacco: Not on file  . Alcohol Use: Yes     social    OB History    Grav Para Term Preterm Abortions TAB SAB Ect Mult Living   2 2 2       2       Review of Systems All other systems negative except as documented in the HPI. All pertinent positives and negatives as reviewed in the HPI.  Allergies  Review of patient's allergies indicates no known allergies.  Home Medications   Current Outpatient Rx  Name Route Sig Dispense Refill  . ACETAMINOPHEN 500 MG PO TABS Oral Take 500 mg by mouth every 6 (six) hours as needed. Pain or fever    . IBUPROFEN 200 MG PO TABS Oral Take 600 mg by mouth  every 8 (eight) hours as needed. pain    . BACITRACIN-NEOMYCIN-POLYMYXIN [id number] EX OINT Topical Apply topically every 12 (twelve) hours. apply to foot      BP 164/83  Pulse 74  Temp(Src) 98 F (36.7 C) (Oral)  Resp 16  Wt 170 lb (77.111 kg)  SpO2 100%  LMP 03/29/2011  Physical Exam Physical Examination: General appearance - alert, well appearing, and in no distress, oriented to person, place, and time and normal appearing weight Mental status - alert, oriented to person, place, and time, normal mood, behavior, speech, dress, motor activity, and thought processes Eyes - pupils equal and reactive, extraocular eye movements intact Ears - bilateral TM's and external ear canals normal Nose - normal and patent, no erythema, discharge or polyps Mouth - mucous membranes moist, pharynx normal without lesions Chest - clear to auscultation, no wheezes, rales or rhonchi, symmetric air entry, no tachypnea, retractions or cyanosis Heart - normal rate, regular rhythm, normal S1, S2, no murmurs, rubs, clicks or gallops Extremities - peripheral pulses normal, no pedal edema, no clubbing or cyanosis, no pedal edema noted  ED Course  Procedures (including critical care time)  Labs Reviewed  CBC - Abnormal; Notable for the following:    MCV 75.2 (*)    Platelets 148 (*)  All other components within normal limits  BASIC METABOLIC PANEL - Abnormal; Notable for the following:    Potassium 3.3 (*)    Glucose, Bld 102 (*)    GFR calc non Af Amer 85 (*)    All other components within normal limits  POCT I-STAT TROPONIN I   Dg Chest 2 View  05/15/2011  *RADIOLOGY REPORT*  Clinical Data: Chest pain.  CHEST - 2 VIEW  Comparison: 03/22/2009.  Findings: The cardiac silhouette, mediastinal and hilar contours are within normal limits and stable. The lungs are clear.  No pleural effusions.  The bony thorax is intact.  IMPRESSION: Normal chest x-ray.  Original Report Authenticated By: P. Loralie Champagne,  M.D.    Patient has reproducible pain in her R chest with no radiation of her pain. The patient states that the pain lasts for seconds at a time. Certain positions make it worse. She is PERC negative. She will be asked to recheck with her PCP as well. The lab testing was negative. The pain began last night and has no exertional component. One enzyme was done due to the length of time that the onset of pain was.   MDM   Date: 05/15/2011  Rate: 69  Rhythm: normal sinus rhythm  QRS Axis: normal  Intervals: normal  ST/T Wave abnormalities: nonspecific T wave changes  Conduction Disutrbances:none  Narrative Interpretation:   Old EKG Reviewed: unchanged MDM Reviewed: nursing note and vitals Reviewed previous: ECG Interpretation: ECG, labs and x-ray             Carlyle Dolly, PA-C 05/15/11 2000

## 2011-05-15 NOTE — Discharge Instructions (Signed)
Your testing here tonight was normal. Return here for any worsening in your condition. Use heat on the area as well.

## 2011-05-15 NOTE — ED Notes (Signed)
Pt refused her Toradol stating she did not need it she has no pain

## 2011-05-15 NOTE — ED Notes (Signed)
Pt states "on my way to work this morning I felt pressure (pt indicates right upper chest), is worse when bending over"; pt c/o tenderness with palpation

## 2011-05-15 NOTE — ED Notes (Signed)
PA at bedside Pt alert and oriented x4. Respirations even and unlabored, bilateral symmetrical rise and fall of chest. Skin warm and dry. In no acute distress. Denies needs.   

## 2011-05-16 NOTE — ED Provider Notes (Signed)
Medical screening examination/treatment/procedure(s) were performed by non-physician practitioner and as supervising physician I was immediately available for consultation/collaboration.  Juliet Rude. Rubin Payor, MD 05/16/11 1610

## 2011-12-25 ENCOUNTER — Emergency Department (HOSPITAL_COMMUNITY)
Admission: EM | Admit: 2011-12-25 | Discharge: 2011-12-26 | Disposition: A | Discharge status: Home / Self Care | Payer: Self-pay | Attending: Emergency Medicine | Admitting: Emergency Medicine

## 2011-12-25 ENCOUNTER — Encounter (HOSPITAL_COMMUNITY): Payer: Self-pay | Admitting: *Deleted

## 2011-12-25 DIAGNOSIS — Z87891 Personal history of nicotine dependence: Secondary | ICD-10-CM | POA: Insufficient documentation

## 2011-12-25 DIAGNOSIS — M79609 Pain in unspecified limb: Secondary | ICD-10-CM | POA: Insufficient documentation

## 2011-12-25 DIAGNOSIS — Z8742 Personal history of other diseases of the female genital tract: Secondary | ICD-10-CM | POA: Insufficient documentation

## 2011-12-25 DIAGNOSIS — M79606 Pain in leg, unspecified: Secondary | ICD-10-CM

## 2011-12-25 NOTE — ED Notes (Signed)
Pt c/o left calf swelling tonight; discolored; painful when doing squats

## 2011-12-26 LAB — BASIC METABOLIC PANEL
GFR calc Af Amer: >90 mL/min (ref >90)
GFR calc non Af Amer: 83 mL/min — ABNORMAL LOW (ref >90)
Potassium: 4.1 mEq/L (ref 3.5–5.1)
Sodium: 138 mEq/L (ref 135–145)

## 2011-12-26 MED ORDER — NAPROXEN 500 MG PO TABS: 500.0000 mg | ORAL_TABLET | Freq: Two times a day (BID) | ORAL | Status: DC

## 2011-12-26 NOTE — ED Provider Notes (Signed)
History     CSN: [id number]  Arrival date & time 12/25/11  2313   First MD Initiated Contact with Patient 12/26/11 0052      No chief complaint on file.   (Consider location/radiation/quality/duration/timing/severity/associated sxs/prior treatment) HPI Comments: 46 year old female with no significant past medical history who presents with approximately one week of left lower extremity pain. She states that initially she had pain in her knee cap but this has eventually spread to her calf. She only feels pain when she is stepping out of her shower, she does not feel pain when she is walking, she is not have pain at night and she has no chest pain or shortness of breath. The symptoms are mild at worst, she has 0/10 symptoms right now. She has never had a blood clot and she has no risk factors for venous thromboembolism. She denies history of cancer, oral contraceptive use, smoking, travel, immobilization, trauma, recent surgery.  The history is provided by the patient.    Past Medical History  Diagnosis Date  . DUB (dysfunctional uterine bleeding)   . Bilateral ovarian cysts   . Fibroid, uterus     Past Surgical History  Procedure Date  . Tubal ligation 2001    Family History  Problem Relation Age of Onset  . Diabetes Mother   . Heart disease Mother   . Hypertension Mother   . Cancer Maternal Aunt   . Hyperthyroidism Brother   . Hypertension Brother     History  Substance Use Topics  . Smoking status: Former Smoker    Types: Cigarettes    Quit date: 01/28/1997  . Smokeless tobacco: Not on file  . Alcohol Use: Yes     Comment: social    OB History    Grav Para Term Preterm Abortions TAB SAB Ect Mult Living   2 2 2       2       Review of Systems  All other systems reviewed and are negative.    Allergies  Review of patient's allergies indicates no known allergies.  Home Medications   Current Outpatient Rx  Name  Route  Sig  Dispense  Refill  .  ACETAMINOPHEN 500 MG PO TABS   Oral   Take 500 mg by mouth every 6 (six) hours as needed. Pain or fever         . IBUPROFEN 200 MG PO TABS   Oral   Take 600 mg by mouth every 8 (eight) hours as needed. pain         . ADULT MULTIVITAMIN W/MINERALS CH   Oral   Take 1 tablet by mouth daily.         Marland Kitchen NAPROXEN 500 MG PO TABS   Oral   Take 1 tablet (500 mg total) by mouth 2 (two) times daily with a meal.   30 tablet   0     BP 154/102  Pulse 74  Temp 98.4 F (36.9 C)  Resp 20  SpO2 100%  LMP 11/25/2011  Physical Exam  Nursing note and vitals reviewed. Constitutional: She appears well-developed and well-nourished. No distress.  HENT:  Head: Normocephalic and atraumatic.  Mouth/Throat: Oropharynx is clear and moist. No oropharyngeal exudate.  Eyes: Conjunctivae normal and EOM are normal. Pupils are equal, round, and reactive to light. Right eye exhibits no discharge. Left eye exhibits no discharge. No scleral icterus.  Neck: Normal range of motion. Neck supple. No JVD present. No thyromegaly present.  Cardiovascular: Normal  rate, regular rhythm, normal heart sounds and intact distal pulses.  Exam reveals no gallop and no friction rub.   No murmur heard.      Pulses at the feet are normal  Pulmonary/Chest: Effort normal and breath sounds normal. No respiratory distress. She has no wheezes. She has no rales.  Abdominal: Soft. Bowel sounds are normal. She exhibits no distension and no mass. There is no tenderness.  Musculoskeletal: Normal range of motion. She exhibits no edema and no tenderness.       No asymmetry or edema to the lower extremities, normal appearing cath and knee joint, it is stable, there is no posterior or anterior drawer sign, he is stable with medial and lateral pressure. There is no joint effusion, she has a supple patella.  Lymphadenopathy:    She has no cervical adenopathy.  Neurological: She is alert. Coordination normal.  Skin: Skin is warm and dry.  No rash noted. No erythema.  Psychiatric: She has a normal mood and affect. Her behavior is normal.    ED Course  Procedures (including critical care time)  Labs Reviewed  BASIC METABOLIC PANEL - Abnormal; Notable for the following:    Glucose, Bld 104 (*)     GFR calc non Af Amer 83 (*)     All other components within normal limits  D-DIMER, QUANTITATIVE   No results found.   1. Lower extremity pain       MDM  The patient is low risk for DVT, d-dimer, basic metabolic panel to rule out hypokalemia, patient stable.  D-dimer negative, potassium normal, patient stable for discharge      Vida Roller, MD 12/26/11 [phone]

## 2012-02-19 ENCOUNTER — Emergency Department (HOSPITAL_COMMUNITY)
Admission: EM | Admit: 2012-02-19 | Discharge: 2012-02-19 | Disposition: A | Discharge status: Home / Self Care | Payer: BC Managed Care – PPO | Source: Home / Self Care

## 2012-02-19 ENCOUNTER — Encounter (HOSPITAL_COMMUNITY): Payer: Self-pay | Admitting: Emergency Medicine

## 2012-02-19 DIAGNOSIS — IMO0002 Reserved for concepts with insufficient information to code with codable children: Secondary | ICD-10-CM

## 2012-02-19 DIAGNOSIS — S8390XA Sprain of unspecified site of unspecified knee, initial encounter: Secondary | ICD-10-CM

## 2012-02-19 NOTE — ED Provider Notes (Signed)
Medical screening examination/treatment/procedure(s) were performed by non-physician practitioner and as supervising physician I was immediately available for consultation/collaboration.  Leslee Home, M.D.   Reuben Likes, MD 02/19/12 [phone]

## 2012-02-19 NOTE — ED Notes (Signed)
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

## 2012-02-19 NOTE — ED Provider Notes (Signed)
History     CSN: [id number]  Arrival date & time 02/19/12  1812   None     Chief Complaint  Patient presents with  . Knee Pain    (Consider location/radiation/quality/duration/timing/severity/associated sxs/prior treatment) HPI Comments: 47 year old female states that she was exercising a little over a month ago and experienced pain in her left knee. 2 hours later she felt like her knee was going to go away and since that time has had swelling and minor pain to the left knee. She went to emergency department for examination of the left knee and was told she did not have a blood clot and to apply ice and take Naprosyn. Since then she has continued to run on the treadmill despite having swelling and pain of the knee. The pain is primarily located over the left medial joint space. No other areas of tenderness. No bony tenderness. There is mild to moderate swelling. No discoloration.   Past Medical History  Diagnosis Date  . DUB (dysfunctional uterine bleeding)   . Bilateral ovarian cysts   . Fibroid, uterus     Past Surgical History  Procedure Date  . Tubal ligation 2001    Family History  Problem Relation Age of Onset  . Diabetes Mother   . Heart disease Mother   . Hypertension Mother   . Cancer Maternal Aunt   . Hyperthyroidism Brother   . Hypertension Brother     History  Substance Use Topics  . Smoking status: Former Smoker    Types: Cigarettes    Quit date: 01/28/1997  . Smokeless tobacco: Not on file  . Alcohol Use: Yes     Comment: social    OB History    Grav Para Term Preterm Abortions TAB SAB Ect Mult Living   2 2 2       2       Review of Systems  Constitutional: Negative for fever, chills and activity change.  HENT: Negative.   Respiratory: Negative.   Cardiovascular: Negative.   Musculoskeletal:       As per HPI  Skin: Negative for color change, pallor and rash.  Neurological: Negative.     Allergies  Review of patient's allergies indicates  no known allergies.  Home Medications   Current Outpatient Rx  Name  Route  Sig  Dispense  Refill  . ACETAMINOPHEN 500 MG PO TABS   Oral   Take 500 mg by mouth every 6 (six) hours as needed. Pain or fever         . IBUPROFEN 200 MG PO TABS   Oral   Take 600 mg by mouth every 8 (eight) hours as needed. pain         . ADULT MULTIVITAMIN W/MINERALS CH   Oral   Take 1 tablet by mouth daily.         Marland Kitchen NAPROXEN 500 MG PO TABS   Oral   Take 1 tablet (500 mg total) by mouth 2 (two) times daily with a meal.   30 tablet   0     BP 152/88  Pulse 69  Temp 99.4 F (37.4 C) (Oral)  Resp 18  SpO2 100%  LMP 12/14/2011  Physical Exam  Nursing note and vitals reviewed. Constitutional: She is oriented to person, place, and time. She appears well-developed and well-nourished. No distress.  HENT:  Head: Normocephalic and atraumatic.  Eyes: EOM are normal. Pupils are equal, round, and reactive to light.  Neck: Normal range of motion.  Neck supple.  Pulmonary/Chest: Effort normal.  Musculoskeletal:       Knee flexion to 100. Extension to 180. No discoloration of the knee but there is mild marked swelling. No bony tenderness. Mild tenderness over the medial joint space. No deformity  Lymphadenopathy:    She has no cervical adenopathy.  Neurological: She is alert and oriented to person, place, and time. No cranial nerve deficit.  Skin: Skin is warm and dry.  Psychiatric: She has a normal mood and affect.    ED Course  Procedures (including critical care time)  Labs Reviewed - No data to display No results found.   1. Knee sprain       MDM   No more running On the treadmill wider knee is swollen and painful. PT knee immobilizer all while you are up and walking. Ice as directed do not put directly onto the skin May take OTC anti-inflammatories if needed for pain Not improving in 7-10 days followup with the orthopedist.        Hayden Rasmussen, NP 02/19/12 1955

## 2012-03-02 ENCOUNTER — Emergency Department (HOSPITAL_COMMUNITY)
Admission: EM | Admit: 2012-03-02 | Discharge: 2012-03-02 | Disposition: A | Discharge status: Home / Self Care | Payer: BC Managed Care – PPO | Source: Home / Self Care | Attending: Emergency Medicine | Admitting: Emergency Medicine

## 2012-03-02 ENCOUNTER — Encounter (HOSPITAL_COMMUNITY): Payer: Self-pay | Admitting: *Deleted

## 2012-03-02 DIAGNOSIS — J039 Acute tonsillitis, unspecified: Secondary | ICD-10-CM

## 2012-03-02 MED ORDER — PENICILLIN V POTASSIUM 500 MG PO TABS: 500.0000 mg | ORAL_TABLET | Freq: Three times a day (TID) | ORAL | Status: DC

## 2012-03-02 NOTE — ED Notes (Signed)
Pt      Reports  Symptoms  Of  sorethroat    Hot flashes   As   Well  As  Headache   And      l  Earache   - pt is  Sitting  Upright on  examtable  Speaking in  Complete  sentances   And  Is  In no a cute  distress

## 2012-03-02 NOTE — ED Provider Notes (Signed)
Chief Complaint  Patient presents with  . Sore Throat    History of Present Illness:   Erin Mcdaniel is a 47 year old female who has had a two-day history of sore throat, left ear pain, felt hot and had sweats, headache, and aching in her eyes. She was exposed to a viral upper respiratory infection but no definite exposure to strep. She denies fever, nasal congestion, rhinorrhea, sneezing, coughing, wheezing, chest pain, or GI symptoms. She was seen here about a month ago for some left knee pain and swelling of the leg. She had gone to the hospital emergency room prior to that and been ruled out for deep venous thrombophlebitis. The pain is better, but she still has some swelling of the knee and lower leg.  Review of Systems:  Other than as noted above, the patient denies any of the following symptoms. Systemic:  No fever, chills, sweats, fatigue, myalgias, headache, or anorexia. Eye:  No redness, pain or drainage. ENT:  No earache, ear congestion, nasal congestion, sneezing, rhinorrhea, sinus pressure, sinus pain, or post nasal drip. Lungs:  No cough, sputum production, wheezing, shortness of breath, or chest pain. GI:  No abdominal pain, nausea, vomiting, or diarrhea. Skin:  No rash or itching.  PMFSH:  Past medical history, family history, social history, meds, allergies, and nurse's notes were reviewed.  There is no known exposure to strep or mono.  No prior history of step or mono.  The patient denies use of tobacco.  Physical Exam:   Vital signs:  BP 135/84  Pulse 72  Temp(Src) 98.6 F (37 C) (Oral)  Resp 16  SpO2 100%  LMP 12/14/2011 General:  Alert, in no distress. Eye:  No conjunctival injection or drainage. Lids were normal. ENT:  TMs and canals were normal, without erythema or inflammation.  Nasal mucosa was clear and uncongested, without drainage.  Mucous membranes were moist.  Exam of pharynx reveals the tonsils to be enlarged, red, swollen, with spots of white exudate.  There  were no oral ulcerations or lesions. Neck:  Supple, no adenopathy, tenderness or mass. Lungs:  No respiratory distress.  Lungs were clear to auscultation, without wheezes, rales or rhonchi.  Breath sounds were clear and equal bilaterally.  Heart:  Regular rhythm, without gallops, murmers or rubs. Skin:  Clear, warm, and dry, without rash or lesions.  Labs:   Results for orders placed during the hospital encounter of 03/02/12  POCT RAPID STREP A ([facility]ARE ONLY)      Result Value Range   Streptococcus, Group A Screen (Direct) NEGATIVE  NEGATIVE    Assessment:  The encounter diagnosis was Tonsillitis.  Plan:   1.  The following meds were prescribed:   New Prescriptions   PENICILLIN V POTASSIUM (VEETID) 500 MG TABLET    Take 1 tablet (500 mg total) by mouth 3 (three) times daily.   2.  The patient was instructed in symptomatic care including hot saline gargles, throat lozenges, infectious precautions, and need to trade out toothbrush. Handouts were given. 3.  The patient was told to return if becoming worse in any way, if no better in 3 or 4 days, and given some red flag symptoms that would indicate earlier return.    Reuben Likes, MD 03/02/12 [phone]

## 2012-03-23 ENCOUNTER — Other Ambulatory Visit (HOSPITAL_COMMUNITY): Payer: Self-pay | Admitting: Internal Medicine

## 2012-03-23 ENCOUNTER — Ambulatory Visit (HOSPITAL_COMMUNITY)
Admission: RE | Admit: 2012-03-23 | Discharge: 2012-03-23 | Disposition: A | Discharge status: Home / Self Care | Payer: BC Managed Care – PPO | Source: Ambulatory Visit | Attending: Internal Medicine | Admitting: Internal Medicine

## 2012-03-23 DIAGNOSIS — M7989 Other specified soft tissue disorders: Secondary | ICD-10-CM

## 2012-03-23 DIAGNOSIS — M79609 Pain in unspecified limb: Secondary | ICD-10-CM

## 2012-03-23 NOTE — Progress Notes (Signed)
Left:  No evidence of DVT, superficial thrombosis, or Baker's cyst.  Right:  Negative for DVT in the common femoral vein.  

## 2012-03-29 ENCOUNTER — Ambulatory Visit (INDEPENDENT_AMBULATORY_CARE_PROVIDER_SITE_OTHER): Payer: BC Managed Care – PPO | Admitting: Obstetrics & Gynecology

## 2012-03-29 ENCOUNTER — Encounter: Payer: Self-pay | Admitting: Obstetrics & Gynecology

## 2012-03-29 VITALS — BP 143/92 | HR 75 | Temp 97.7°F | Ht 66.0 in | Wt 194.1 lb

## 2012-03-29 DIAGNOSIS — Z01419 Encounter for gynecological examination (general) (routine) without abnormal findings: Secondary | ICD-10-CM | POA: Insufficient documentation

## 2012-03-29 NOTE — Patient Instructions (Signed)
Mammography Mammography is an X-ray of the breasts to look for changes that are not normal. The X-ray image is called a mammogram. This procedure can screen for breast cancer, can detect cancer early, and can diagnose cancer.  LET YOUR CAREGIVER KNOW ABOUT:  Breast implants.  Previous breast disease, biopsy, or surgery.  If you are breastfeeding.  Medicines taken, including vitamins, herbs, eyedrops, over-the-counter medicines, and creams.  Use of steroids (by mouth or creams).  Possibility of pregnancy, if this applies. RISKS AND COMPLICATIONS  Exposure to radiation, but at very low levels.  The results may be misinterpreted.  The results may not be accurate.  Mammography may lead to further tests.  Mammography may not catch certain cancers. BEFORE THE PROCEDURE  Schedule your test about 7 days after your menstrual period. This is when your breasts are the least tender and have signs of hormone changes.  If you have had a mammography done at a different facility in the past, get the mammogram X-rays or have them sent to your current exam facility in order to compare them.  Wash your breasts and under your arms the day of the test.  Do not wear deodorants, perfumes, or powders anywhere on your body.  Wear clothes that you can change in and out of easily. PROCEDURE Relax as much as possible during the test. Any discomfort during the test will be very brief. The test should take less than 30 minutes. The following will happen:  You will undress from the waist up and put on a gown.  You will stand in front of the X-ray machine.  Each breast will be placed between 2 plastic or glass plates. The plates will compress your breast for a few seconds.  X-rays will be taken from different angles of the breast. AFTER THE PROCEDURE  The mammogram will be examined.  Depending on the quality of the images, you may need to repeat certain parts of the test.  Ask when your test  results will be ready. Make sure you get your test results.  You may resume normal activities. Document Released: 12/28/1999 Document Revised: 03/24/2011 Document Reviewed: 10/20/2010 Exit[employer] Patient Information 2013 [employer].  

## 2012-03-29 NOTE — Progress Notes (Signed)
Patient ID: Erin Mcdaniel, female   DOB: April 06, 1965, 47 y.o.   MRN: [id number]  Chief Complaint  Patient presents with  . Gynecologic Exam    HPI Erin Mcdaniel is a 47 y.o. female.  Yearly gyn exam, last pap 01/2011 was normal. No LMP recorded. Patient is not currently having periods (Reason: Perimenopausal). LMP was 10/2011, previously was skipping periods. No VMS  HPI  Past Medical History  Diagnosis Date  . DUB (dysfunctional uterine bleeding)   . Bilateral ovarian cysts   . Fibroid, uterus   6-8 mm fibroid on [place], nl ovaries  Past Surgical History  Procedure Laterality Date  . Tubal ligation  2001    Family History  Problem Relation Age of Onset  . Diabetes Mother   . Heart disease Mother   . Hypertension Mother   . Cancer Maternal Aunt   . Hyperthyroidism Brother   . Hypertension Brother     Social History History  Substance Use Topics  . Smoking status: Former Smoker    Types: Cigarettes    Quit date: 01/28/1997  . Smokeless tobacco: Not on file  . Alcohol Use: Yes     Comment: social    No Known Allergies  Current Outpatient Prescriptions  Medication Sig Dispense Refill  . acetaminophen (TYLENOL) 500 MG tablet Take 500 mg by mouth every 6 (six) hours as needed. Pain or fever      . ibuprofen (ADVIL,MOTRIN) 200 MG tablet Take 600 mg by mouth every 8 (eight) hours as needed. pain      . Multiple Vitamin (MULTIVITAMIN WITH MINERALS) TABS Take 1 tablet by mouth daily.      . naproxen (NAPROSYN) 500 MG tablet Take 1 tablet (500 mg total) by mouth 2 (two) times daily with a meal.  30 tablet  0  . penicillin v potassium (VEETID) 500 MG tablet Take 1 tablet (500 mg total) by mouth 3 (three) times daily.  30 tablet  0  . [DISCONTINUED] hydrochlorothiazide (HYDRODIURIL) 12.5 MG tablet Take 2 tablets (25 mg total) by mouth daily.  30 tablet  3   No current facility-administered medications for this visit.    Review of Systems Review of Systems  Constitutional:  Negative.   Respiratory: Negative.   Gastrointestinal: Negative.   Genitourinary: Negative.     Blood pressure 143/92, pulse 75, temperature 97.7 F (36.5 C), temperature source Oral, height 5\' 6"  (1.676 m), weight 194 lb 1.6 oz (88.043 kg).  Physical Exam Physical Exam  Nursing note and vitals reviewed. Constitutional: She appears well-developed and well-nourished. No distress.  Neck: Normal range of motion. No thyromegaly present.  Cardiovascular: Normal rate.   Pulmonary/Chest: Effort normal. No respiratory distress.  Abdominal: Soft. She exhibits no distension. There is no tenderness.  Genitourinary: Vagina normal and uterus normal. No vaginal discharge found.  No masses or tenderness  Musculoskeletal: Normal range of motion.  Skin: Skin is warm and dry.  Psychiatric: She has a normal mood and affect. Her behavior is normal.    Data Reviewed Office notes and pap result 2013  Assessment    Perimenopausal exam normal. Pap every 3 years or 73yr with cotesting. Yearly mammograms     Plan    Mammogram        Erin Mcdaniel 03/29/2012, 2:46 PM

## 2012-04-06 ENCOUNTER — Telehealth: Payer: Self-pay | Admitting: Internal Medicine

## 2012-04-06 NOTE — Telephone Encounter (Signed)
Noted  

## 2012-04-06 NOTE — Telephone Encounter (Signed)
New Prob   Pt called in wanting to make an appt specifically with Erin Mcdaniel. When reason for visit she said chest pain. I explained first available for Dr. Tenny Craw wouldn't be until May 12 and I could possibly get her in sooner with another provider, but she said she specifically wanted to see Dr. Tenny Craw. She said she went to her PCP about her chest pain and he ran some tests for her so she didn't seem concerned about her appt eing so far away.

## 2012-04-28 ENCOUNTER — Ambulatory Visit (HOSPITAL_COMMUNITY)
Admission: RE | Admit: 2012-04-28 | Discharge: 2012-04-28 | Disposition: A | Discharge status: Home / Self Care | Payer: BC Managed Care – PPO | Source: Ambulatory Visit | Attending: Obstetrics & Gynecology | Admitting: Obstetrics & Gynecology

## 2012-04-28 DIAGNOSIS — Z1231 Encounter for screening mammogram for malignant neoplasm of breast: Secondary | ICD-10-CM | POA: Insufficient documentation

## 2012-04-28 DIAGNOSIS — Z01419 Encounter for gynecological examination (general) (routine) without abnormal findings: Secondary | ICD-10-CM

## 2012-05-24 ENCOUNTER — Ambulatory Visit (INDEPENDENT_AMBULATORY_CARE_PROVIDER_SITE_OTHER): Payer: BC Managed Care – PPO | Admitting: Internal Medicine

## 2012-05-24 ENCOUNTER — Encounter: Payer: Self-pay | Admitting: Internal Medicine

## 2012-05-24 VITALS — BP 134/93 | HR 64 | Ht 65.0 in | Wt 190.8 lb

## 2012-05-24 DIAGNOSIS — I10 Essential (primary) hypertension: Secondary | ICD-10-CM

## 2012-05-24 DIAGNOSIS — R079 Chest pain, unspecified: Secondary | ICD-10-CM

## 2012-05-24 NOTE — Progress Notes (Signed)
HPI Patient is a 47 yo who is self referred for CP. Age 72 chest pressure in AM only  Smoked at time.  Pain once so severe with movement of R arm. Went to ER the next day  Admitted to hosp.   In [place]  Had stress test(thall) abnormal  Had cath that showed no signif disease Stopped smoking A few years later age 81 pregnant  No problems with pregnancy  Platelets went down. No cardiology problems About 5 years ago had prob with chest pain  Had pressure sensation. Seen by cardiologist  Had echo and stress test  Told fine  Past couple months has been dong exercising.  Feels like someone punched her in chest. Different from pressure.   Doing upper body  Total had 2 spells.  Then stopped Pain not pleuritic  Lasts just a second  Not associated with activity Walking on treadmill At 2.0 to 4 miles per houll  Leg limits. Stopped.  Told Dr Tyson Dense. EKG done    Breathing is OK No Known Allergies  Current Outpatient Prescriptions  Medication Sig Dispense Refill  . acetaminophen (TYLENOL) 500 MG tablet Take 500 mg by mouth every 6 (six) hours as needed. Pain or fever      . ibuprofen (ADVIL,MOTRIN) 200 MG tablet Take 600 mg by mouth every 8 (eight) hours as needed. pain      . Multiple Vitamin (MULTIVITAMIN WITH MINERALS) TABS Take 1 tablet by mouth daily.      . [DISCONTINUED] hydrochlorothiazide (HYDRODIURIL) 12.5 MG tablet Take 2 tablets (25 mg total) by mouth daily.  30 tablet  3   No current facility-administered medications for this visit.    Past Medical History  Diagnosis Date  . DUB (dysfunctional uterine bleeding)   . Bilateral ovarian cysts   . Fibroid, uterus     Past Surgical History  Procedure Laterality Date  . Tubal ligation  2001    Family History  Problem Relation Age of Onset  . Diabetes Mother   . Heart disease Mother   . Hypertension Mother   . Cancer Maternal Aunt   . Hyperthyroidism Brother   . Hypertension Brother     History   Social History  . Marital  Status: Single    Spouse Name: N/A    Number of Children: N/A  . Years of Education: N/A   Occupational History  . Not on file.   Social History Main Topics  . Smoking status: Former Smoker    Types: Cigarettes    Quit date: 01/28/1997  . Smokeless tobacco: Not on file  . Alcohol Use: Yes     Comment: social  . Drug Use: No  . Sexually Active: No   Other Topics Concern  . Not on file   Social History Narrative  . No narrative on file    Review of Systems:  All systems reviewed.  They are negative to the above problem except as previously stated.  Vital Signs: BP 134/93  Pulse 64  Ht 5\' 5"  (1.651 m)  Wt 190 lb 12.8 oz (86.546 kg)  BMI 31.75 kg/m2  Physical Exam Patient is in NAD HEENT:  Normocephalic, atraumatic. EOMI, PERRLA.  Neck: JVP is normal.  No bruits.  Lungs: clear to auscultation. No rales no wheezes.  Heart: Regular rate and rhythm. Normal S1, S2. No S3.   No significant murmurs. PMI not displaced.  Abdomen:  Supple, nontender. Normal bowel sounds. No masses. No hepatomegaly.  Extremities:   Good  distal pulses throughout. No lower extremity edema.  Musculoskeletal :moving all extremities.  Neuro:   alert and oriented x3.  CN II-XII grossly intact.  EKG  SR  63 bpm.  RSr' V1.  Nonspecific ST T wave changes. Assessment and Plan:  1.  CP  I do not think it is cardiac in origin  Atypical.  Probable musculoskeletal.  Reassured patient  Encouraged her to stay active.  WIll get records  2  HTN  Will need to be followed  F/U with primary  Otherwise f/u cardiology if change/worsening of above.

## 2012-05-25 ENCOUNTER — Telehealth: Payer: Self-pay | Admitting: Internal Medicine

## 2012-05-25 NOTE — Patient Instructions (Signed)
Keep on same meds.  F/U with primary physician

## 2012-05-25 NOTE — Telephone Encounter (Signed)
2 Release Forms faxed [facility] @ [phone] &[facility] @[phone] 05/25/12/km

## 2012-06-10 ENCOUNTER — Telehealth: Payer: Self-pay | Admitting: Internal Medicine

## 2012-06-10 NOTE — Telephone Encounter (Signed)
Records REc From [facility] in [place], gave to [place] 06/10/12/KM

## 2012-06-16 ENCOUNTER — Telehealth: Payer: Self-pay | Admitting: Internal Medicine

## 2012-06-16 NOTE — Telephone Encounter (Signed)
Spoke With MR person ( Who would Not give me her name) asked her for the Status of Getting Chart From [facility] on this Pt She stated their Is NO Chart In [facility]. I left My name & # again and asked If Delores would call me back she ws Currently at [facility] 06/16/12/KM

## 2012-06-16 NOTE — Telephone Encounter (Signed)
Erin Mcdaniel/[facility] called back said she spoke with Their [facility] and they have No RECORD On This Pt. Will also Let Kandee Keen Know  06/16/12/KM

## 2012-06-16 NOTE — Telephone Encounter (Signed)
Spoke with Delores/MR at [facility] asked her for the Status Of The Records of Pt, The ROI was Faxed In 05/24/12 & 06/15/12 She said she has The ROI but The Chart Was not Ordered From Warehouse. Left Her with My Name & # here at [facility] and asked if she could call  Me back Once she Checks the [facility] For Chart. 06/16/12/KM

## 2012-08-22 ENCOUNTER — Emergency Department (HOSPITAL_COMMUNITY)
Admission: EM | Admit: 2012-08-22 | Discharge: 2012-08-22 | Disposition: A | Discharge status: Home / Self Care | Payer: BC Managed Care – PPO | Attending: Emergency Medicine | Admitting: Emergency Medicine

## 2012-08-22 ENCOUNTER — Emergency Department (HOSPITAL_COMMUNITY): Payer: BC Managed Care – PPO

## 2012-08-22 ENCOUNTER — Encounter (HOSPITAL_COMMUNITY): Payer: Self-pay | Admitting: *Deleted

## 2012-08-22 DIAGNOSIS — Z79899 Other long term (current) drug therapy: Secondary | ICD-10-CM | POA: Insufficient documentation

## 2012-08-22 DIAGNOSIS — Z7982 Long term (current) use of aspirin: Secondary | ICD-10-CM | POA: Insufficient documentation

## 2012-08-22 DIAGNOSIS — Z8639 Personal history of other endocrine, nutritional and metabolic disease: Secondary | ICD-10-CM | POA: Insufficient documentation

## 2012-08-22 DIAGNOSIS — Z87891 Personal history of nicotine dependence: Secondary | ICD-10-CM | POA: Insufficient documentation

## 2012-08-22 DIAGNOSIS — R0789 Other chest pain: Secondary | ICD-10-CM | POA: Insufficient documentation

## 2012-08-22 DIAGNOSIS — M2550 Pain in unspecified joint: Secondary | ICD-10-CM | POA: Insufficient documentation

## 2012-08-22 DIAGNOSIS — Z862 Personal history of diseases of the blood and blood-forming organs and certain disorders involving the immune mechanism: Secondary | ICD-10-CM | POA: Insufficient documentation

## 2012-08-22 LAB — CBC WITH DIFFERENTIAL/PLATELET
Basophils Relative: 0 % (ref 0–1)
Eosinophils Relative: 2 % (ref 0–5)
Hemoglobin: 13.4 g/dL (ref 12.0–15.0)
Lymphocytes Relative: 42 % (ref 12–46)
MCH: 26.5 pg (ref 26.0–34.0)
Monocytes Absolute: 0.4 10*3/uL (ref 0.1–1.0)
Monocytes Relative: 5 % (ref 3–12)
Neutrophils Relative %: 51 % (ref 43–77)
Platelets: 149 10*3/uL — ABNORMAL LOW (ref 150–400)
RBC: 5.06 MIL/uL (ref 3.87–5.11)
WBC: 7.2 10*3/uL (ref 4.0–10.5)

## 2012-08-22 LAB — BASIC METABOLIC PANEL
Calcium: 9 mg/dL (ref 8.4–10.5)
GFR calc non Af Amer: 76 mL/min — ABNORMAL LOW (ref >90)
Glucose, Bld: 96 mg/dL (ref 70–99)
Potassium: 4 mEq/L (ref 3.5–5.1)
Sodium: 138 mEq/L (ref 135–145)

## 2012-08-22 LAB — CK TOTAL AND CKMB (NOT AT ARMC)
CK, MB: 2.4 ng/mL (ref 0.3–4.0)
Relative Index: 2.2 (ref 0.0–2.5)
Total CK: 107 U/L (ref 7–177)

## 2012-08-22 LAB — POCT I-STAT TROPONIN I: Troponin i, poc: 0 ng/mL (ref 0.00–0.08)

## 2012-08-22 NOTE — ED Provider Notes (Signed)
CSN: [id number]     Arrival date & time 08/22/12  1912 History     First MD Initiated Contact with Patient 08/22/12 2021     Chief Complaint  Patient presents with  . Chest Pain   (Consider location/radiation/quality/duration/timing/severity/associated sxs/prior Treatment) HPI Comments: Patient is a 47 year old female who presents today with chest pain worsening for the past few months. The pain feels as though someone punched her in the sternum. The pain lasts for a few seconds when it comes on. It is triggered by movement. Today it was associated with a sharp elbow pain. The pain began in her elbow and radiated up to her shoulder. Her elbow pain is also worse with movement. She was seen by Dr. Dietrich Pates In May. That time she was told that her pain was noncardiac in nature. The pain is not pleuritic. It is not associated with shortness of breath, diaphoresis, nausea, vomiting. No recent surgeries, exogenous estrogen, prior history of DVT or PE, unilateral leg swelling. She has had pain like this in the past when she was 47 years old. She was a smoker at that time and a cardiac cath was done. Cardiac cath was clean at that time. She was followed by cardiology during her pregnancy and had a normal pregnancy.   The history is provided by the patient. No language interpreter was used.    Past Medical History  Diagnosis Date  . DUB (dysfunctional uterine bleeding)   . Bilateral ovarian cysts   . Fibroid, uterus    Past Surgical History  Procedure Laterality Date  . Tubal ligation  2001   Family History  Problem Relation Age of Onset  . Diabetes Mother   . Heart disease Mother   . Hypertension Mother   . Cancer Maternal Aunt   . Hyperthyroidism Brother   . Hypertension Brother    History  Substance Use Topics  . Smoking status: Former Smoker    Types: Cigarettes    Quit date: 01/28/1997  . Smokeless tobacco: Not on file  . Alcohol Use: Yes     Comment: social   OB History    Grav Para Term Preterm Abortions TAB SAB Ect Mult Living   2 2 2       2      Review of Systems  Constitutional: Negative for fever and chills.  Respiratory: Negative for shortness of breath.   Cardiovascular: Positive for chest pain.  Gastrointestinal: Negative for nausea, vomiting and abdominal pain.  Musculoskeletal: Positive for arthralgias.  Skin: Negative for rash.  Neurological: Negative for weakness and numbness.  All other systems reviewed and are negative.    Allergies  Review of patient's allergies indicates no known allergies.  Home Medications   Current Outpatient Rx  Name  Route  Sig  Dispense  Refill  . acetaminophen (TYLENOL) 500 MG tablet   Oral   Take 500 mg by mouth every 6 (six) hours as needed. Pain or fever         . aspirin EC 81 MG tablet   Oral   Take 81 mg by mouth daily.         Marland Kitchen ibuprofen (ADVIL,MOTRIN) 200 MG tablet   Oral   Take 600 mg by mouth every 8 (eight) hours as needed. pain         . Multiple Vitamin (MULTIVITAMIN WITH MINERALS) TABS   Oral   Take 1 tablet by mouth daily.          BP  139/90  Pulse 69  Temp(Src) 98.4 F (36.9 C) (Oral)  Resp 18  SpO2 98% Physical Exam  Nursing note and vitals reviewed. Constitutional: She is oriented to person, place, and time. She appears well-developed and well-nourished. No distress.  HENT:  Head: Normocephalic and atraumatic.  Right Ear: External ear normal.  Left Ear: External ear normal.  Nose: Nose normal.  Mouth/Throat: Oropharynx is clear and moist.  Eyes: Conjunctivae are normal.  Neck: Normal range of motion.  Cardiovascular: Normal rate, regular rhythm, normal heart sounds, intact distal pulses and normal pulses.   Pulmonary/Chest: Effort normal and breath sounds normal. No stridor. No respiratory distress. She has no wheezes. She has no rales. She exhibits no tenderness and no bony tenderness.  Abdominal: Soft. She exhibits no distension. There is no tenderness.   Musculoskeletal: Normal range of motion.  Neurological: She is alert and oriented to person, place, and time. She has normal strength.  Skin: Skin is warm and dry. She is not diaphoretic. No erythema.  Psychiatric: She has a normal mood and affect. Her behavior is normal.    ED Course   Procedures (including critical care time)  Labs Reviewed  BASIC METABOLIC PANEL - Abnormal; Notable for the following:    GFR calc non Af Amer 76 (*)    GFR calc Af Amer 88 (*)    All other components within normal limits  CBC WITH DIFFERENTIAL - Abnormal; Notable for the following:    MCV 74.3 (*)    Platelets 149 (*)    All other components within normal limits  CK TOTAL AND CKMB  POCT I-STAT TROPONIN I    Date: 08/22/2012  Rate: 68  Rhythm: normal sinus rhythm  QRS Axis: normal  Intervals: normal  ST/T Wave abnormalities: nonspecific T wave changes  Conduction Disutrbances:none  Narrative Interpretation:   Old EKG Reviewed: unchanged    Dg Chest 2 View  08/22/2012   *RADIOLOGY REPORT*  Clinical Data: Chest pain  CHEST - 2 VIEW  Comparison: 05/15/2011  Findings: Cardiac leads overlie the chest.  Heart size is normal. Lung volumes are low but clear.  No pleural effusion.  No acute osseous finding.  IMPRESSION: No acute cardiopulmonary process.   Original Report Authenticated By: Christiana Pellant, M.D.   1. Atypical chest pain     MDM  Patient is to be discharged with recommendation to follow up with PCP in regards to today's hospital visit. Chest pain is not likely of cardiac or pulmonary etiology d/t presentation, perc negative, VSS, no tracheal deviation, no JVD or new murmur, RRR, breath sounds equal bilaterally, EKG without acute abnormalities, negative troponin, and negative CXR. Pt has been advised start a PPI and return to the ED is CP becomes exertional, associated with diaphoresis or nausea, radiates to left jaw/arm, worsens or becomes concerning in any way. Pt appears reliable for  follow up and is agreeable to discharge.   Case has been discussed with Dr. Rhunette Croft who agrees with the above plan to discharge.     Mora Bellman, PA-C 08/22/12 2201

## 2012-08-22 NOTE — ED Notes (Signed)
Pt ambulating independently w/ steady gait on d/c in no acute distress, A&Ox4.D/c instructions reviewed w/ pt and family - pt and family deny any further questions or concerns at present.  

## 2012-08-22 NOTE — ED Notes (Signed)
Pt states she has been having sharp shooting type pain (points to midsternal) for a while. Evaluated by Dr Tenny Craw Cardologist in May with workup. Nothing noted on workup. Pt now concerned that she is feeling some symptoms in her left arm. At present no pain or symptoms.

## 2012-08-26 NOTE — ED Provider Notes (Signed)
Medical screening examination/treatment/procedure(s) were performed by non-physician practitioner and as supervising physician I was immediately available for consultation/collaboration.  Amely Voorheis, MD 08/26/12 0726 

## 2012-09-17 ENCOUNTER — Encounter (HOSPITAL_COMMUNITY): Payer: Self-pay

## 2012-09-17 ENCOUNTER — Emergency Department (HOSPITAL_COMMUNITY)
Admission: EM | Admit: 2012-09-17 | Discharge: 2012-09-17 | Disposition: A | Discharge status: Home / Self Care | Payer: BC Managed Care – PPO | Source: Home / Self Care | Attending: Family Medicine | Admitting: Family Medicine

## 2012-09-17 DIAGNOSIS — IMO0002 Reserved for concepts with insufficient information to code with codable children: Secondary | ICD-10-CM

## 2012-09-17 DIAGNOSIS — L309 Dermatitis, unspecified: Secondary | ICD-10-CM

## 2012-09-17 DIAGNOSIS — L259 Unspecified contact dermatitis, unspecified cause: Secondary | ICD-10-CM

## 2012-09-17 DIAGNOSIS — S46912A Strain of unspecified muscle, fascia and tendon at shoulder and upper arm level, left arm, initial encounter: Secondary | ICD-10-CM

## 2012-09-17 DIAGNOSIS — N644 Mastodynia: Secondary | ICD-10-CM

## 2012-09-17 MED ORDER — TRIAMCINOLONE ACETONIDE 0.1 % EX OINT: TOPICAL_OINTMENT | Freq: Two times a day (BID) | CUTANEOUS | Status: DC

## 2012-09-17 MED ORDER — IBUPROFEN 600 MG PO TABS: 600.0000 mg | ORAL_TABLET | Freq: Three times a day (TID) | ORAL | Status: DC | PRN

## 2012-09-17 MED ORDER — CYCLOBENZAPRINE HCL 10 MG PO TABS: 10.0000 mg | ORAL_TABLET | Freq: Two times a day (BID) | ORAL | Status: DC | PRN

## 2012-09-17 MED ORDER — TRAMADOL HCL 50 MG PO TABS: 50.0000 mg | ORAL_TABLET | Freq: Four times a day (QID) | ORAL | Status: DC | PRN

## 2012-09-17 NOTE — ED Notes (Signed)
C/o tenderness, swelling in left breast , family hist of breast CA. Friend observed her left breast larger than the right .Marland Kitchen Has tender spot on back where she reportedly  scratched herself on back w ink pen when it was itching

## 2012-09-17 NOTE — ED Provider Notes (Signed)
CSN: [id number]     Arrival date & time 09/17/12  8295 History   First MD Initiated Contact with Patient 09/17/12 0915     Chief Complaint  Patient presents with  . Breast Pain   (Consider location/radiation/quality/duration/timing/severity/associated sxs/prior Treatment) HPI Comments: 47 year old female here complaining intermittent breast tenderness especially when she gets her menstrual period. She is perimenopausal and status she does not have a period every month. States she's not having pain currently but she noticed that her left breast is larger than the right and wants me to check. Status she started to have mammograms at age 88 because of breast tenderness and has always had normal mammograms. Her last mammogram was April this year and it was normal as per her report. Denies mass or lumps and denies discharge from her nipple. She's also complaining of left upper back discomfort with radiation to worse her left shoulder and left proximal arm since last night. She denies any known injury or recent falls. Status she carries a heavy bag with her laptop on her left side usually. Pain is present since yesterday. Denies extremity weakness or numbness. Patient states she was seen in the emergency department on August 10 for atypical chest pain and had a rule out coronary artery disease workup with normal results. Also patient complaining of areas of her skin where is dry and itchy.   Past Medical History  Diagnosis Date  . DUB (dysfunctional uterine bleeding)   . Bilateral ovarian cysts   . Fibroid, uterus    Past Surgical History  Procedure Laterality Date  . Tubal ligation  2001   Family History  Problem Relation Age of Onset  . Diabetes Mother   . Heart disease Mother   . Hypertension Mother   . Cancer Maternal Aunt   . Hyperthyroidism Brother   . Hypertension Brother    History  Substance Use Topics  . Smoking status: Former Smoker    Types: Cigarettes    Quit date:  01/28/1997  . Smokeless tobacco: Not on file  . Alcohol Use: Yes     Comment: social   OB History   Grav Para Term Preterm Abortions TAB SAB Ect Mult Living   2 2 2       2      Review of Systems  Constitutional: Negative for fever, chills, diaphoresis and fatigue.  Respiratory: Positive for chest tightness. Negative for cough, shortness of breath and wheezing.   Cardiovascular: Negative for chest pain, palpitations and leg swelling.  Gastrointestinal: Negative for nausea, vomiting and abdominal pain.  Musculoskeletal: Positive for myalgias and back pain.  Skin: Positive for rash.  Neurological: Negative for dizziness and headaches.  All other systems reviewed and are negative.    Allergies  Review of patient's allergies indicates no known allergies.  Home Medications   Current Outpatient Rx  Name  Route  Sig  Dispense  Refill  . acetaminophen (TYLENOL) 500 MG tablet   Oral   Take 500 mg by mouth every 6 (six) hours as needed. Pain or fever         . aspirin EC 81 MG tablet   Oral   Take 81 mg by mouth daily.         . cyclobenzaprine (FLEXERIL) 10 MG tablet   Oral   Take 1 tablet (10 mg total) by mouth 2 (two) times daily as needed for muscle spasms.   20 tablet   0   . ibuprofen (ADVIL,MOTRIN) 600 MG tablet  Oral   Take 1 tablet (600 mg total) by mouth every 8 (eight) hours as needed for pain. Take with food   30 tablet   0   . Multiple Vitamin (MULTIVITAMIN WITH MINERALS) TABS   Oral   Take 1 tablet by mouth daily.         . traMADol (ULTRAM) 50 MG tablet   Oral   Take 1 tablet (50 mg total) by mouth every 6 (six) hours as needed for pain.   15 tablet   0   . triamcinolone ointment (KENALOG) 0.1 %   Topical   Apply topically 2 (two) times daily.   30 g   0    BP 143/96  Pulse 68  Temp(Src) 98.1 F (36.7 C) (Oral)  Resp 20  SpO2 95% Physical Exam  Nursing note and vitals reviewed. Constitutional: She is oriented to person, place, and  time. She appears well-developed and well-nourished. No distress.  HENT:  Head: Normocephalic and atraumatic.  Neck: Neck supple. No JVD present.  Cardiovascular: Normal rate, regular rhythm and normal heart sounds.  Exam reveals no gallop and no friction rub.   No murmur heard. Pulmonary/Chest: Effort normal and breath sounds normal. No respiratory distress. She has no wheezes. She has no rales. She exhibits no tenderness.  Genitourinary:  Breasts: left appears slightly larger than right. No obvious deformities, no skin changes, no nipple retraction or discharge. No masses or lumps. No tenderness to palpation. No axillary lymphadenopathies.   Musculoskeletal:  Spine central: No obvious scoliosis or kyphosis. No pain over bone processes in entire spine.  Good range of motion.  Reported pain with palpation over mid left scapular area impress increased muscle tone pain increased with scapular rotation although scapula rotation appears symmetric compared with right. Left shoulder: No obvious deformity, no erythema, swelling, bruising or increased temperature. Diffused tenderness anterior and superior to glenohumeral joint. FROM. Patient able to abduct left arm at shoulder joint past 90 degrees with no pain. Negative empty can test. Entire left upper extremity appears neurovascularly intact.   Lymphadenopathy:    She has no cervical adenopathy.  Neurological: She is alert and oriented to person, place, and time.  Skin: She is not diaphoretic.  There are dry slightly hyperpigmented pruriginous patches in the skin of upper arms and torso.    ED Course  Procedures (including critical care time) Labs Review Labs Reviewed - No data to display Imaging Review No results found.  MDM   1. Muscle strain of scapular region, left, initial encounter   2. Eczema   3. Breast tenderness in female    -Muscle strain treated with Flexeril, ibuprofen and tramadol. Also recommended shoulder exercises for  rehabilitation. -For eczema prescribed triamcinolone ointment 0.1% and discussed skin lubrication routine. -Normal breast exam today is reassuring that patient had normal mammogram 4 months ago likely tenderness related to hormonal changes. Asked to followup with her primary care provider if new or worsening symptoms. Supportive care and red flags that should prompt patient return to medical attention discussed with patient and provided in writing.  Sharin Grave, MD 09/17/12 1024

## 2012-11-18 ENCOUNTER — Other Ambulatory Visit: Payer: Self-pay

## 2013-04-20 ENCOUNTER — Emergency Department (HOSPITAL_COMMUNITY)
Admission: EM | Admit: 2013-04-20 | Discharge: 2013-04-20 | Disposition: A | Discharge status: Home / Self Care | Payer: BC Managed Care – PPO | Source: Home / Self Care | Attending: Family Medicine | Admitting: Family Medicine

## 2013-04-20 ENCOUNTER — Encounter (HOSPITAL_COMMUNITY): Payer: Self-pay | Admitting: Emergency Medicine

## 2013-04-20 DIAGNOSIS — B0053 Herpesviral conjunctivitis: Secondary | ICD-10-CM

## 2013-04-20 DIAGNOSIS — B0052 Herpesviral keratitis: Secondary | ICD-10-CM

## 2013-04-20 MED ORDER — ACYCLOVIR 400 MG PO TABS: 400.0000 mg | ORAL_TABLET | Freq: Every day | ORAL | Status: DC

## 2013-04-20 MED ORDER — TRIFLURIDINE 1 % OP SOLN: 1.0000 [drp] | Freq: Four times a day (QID) | OPHTHALMIC | Status: DC

## 2013-04-20 NOTE — ED Provider Notes (Signed)
Medical screening examination/treatment/procedure(s) were performed by resident physician or non-physician practitioner and as supervising physician I was immediately available for consultation/collaboration.   Charlita Brian DOUGLAS MD.   Rashied Corallo D Chanta Bauers, MD 04/20/13 2121 

## 2013-04-20 NOTE — ED Provider Notes (Signed)
CSN: [id number]     Arrival date & time 04/20/13  1646 History   First MD Initiated Contact with Patient 04/20/13 1826     Chief Complaint  Patient presents with  . Conjunctivitis   (Consider location/radiation/quality/duration/timing/severity/associated sxs/prior Treatment) HPI Comments: Patient states she woke with a few small tender vesicles along the bridge of the left side of her nose that extend to the medial canthus of her left eye. As the day has progressed theses vesicles have become more uncomfortable and swollen and her left eye has become red and irritated. Denies changes in vision, fever, discharge from eye or contact lens use.   Patient is a 48 y.o. female presenting with eye problem. The history is provided by the patient.  Eye Problem Location:  L eye Quality:  Aching Severity:  Moderate Onset quality:  Gradual Duration:  1 day Timing:  Constant Progression:  Worsening Chronicity:  New   Past Medical History  Diagnosis Date  . DUB (dysfunctional uterine bleeding)   . Bilateral ovarian cysts   . Fibroid, uterus    Past Surgical History  Procedure Laterality Date  . Tubal ligation  2001   Family History  Problem Relation Age of Onset  . Diabetes Mother   . Heart disease Mother   . Hypertension Mother   . Cancer Maternal Aunt   . Hyperthyroidism Brother   . Hypertension Brother    History  Substance Use Topics  . Smoking status: Former Smoker    Types: Cigarettes    Quit date: 01/28/1997  . Smokeless tobacco: Not on file  . Alcohol Use: Yes     Comment: social   OB History   Grav Para Term Preterm Abortions TAB SAB Ect Mult Living   2 2 2       2      Review of Systems  All other systems reviewed and are negative.   Allergies  Review of patient's allergies indicates no known allergies.  Home Medications   Current Outpatient Rx  Name  Route  Sig  Dispense  Refill  . acetaminophen (TYLENOL) 500 MG tablet   Oral   Take 500 mg by mouth every  6 (six) hours as needed. Pain or fever         . ibuprofen (ADVIL,MOTRIN) 600 MG tablet   Oral   Take 1 tablet (600 mg total) by mouth every 8 (eight) hours as needed for pain. Take with food   30 tablet   0   . acyclovir (ZOVIRAX) 400 MG tablet   Oral   Take 1 tablet (400 mg total) by mouth 5 (five) times daily. X 7 days   35 tablet   0   . aspirin EC 81 MG tablet   Oral   Take 81 mg by mouth daily.         . cyclobenzaprine (FLEXERIL) 10 MG tablet   Oral   Take 1 tablet (10 mg total) by mouth 2 (two) times daily as needed for muscle spasms.   20 tablet   0   . Multiple Vitamin (MULTIVITAMIN WITH MINERALS) TABS   Oral   Take 1 tablet by mouth daily.         . traMADol (ULTRAM) 50 MG tablet   Oral   Take 1 tablet (50 mg total) by mouth every 6 (six) hours as needed for pain.   15 tablet   0   . triamcinolone ointment (KENALOG) 0.1 %   Topical  Apply topically 2 (two) times daily.   30 g   0   . trifluridine (VIROPTIC) 1 % ophthalmic solution   Left Eye   Place 1 drop into the left eye every 6 (six) hours. X 7 days   7.5 mL   0    BP 149/83  Pulse 76  Temp(Src) 99.5 F (37.5 C) (Oral)  Resp 16  SpO2 100% Physical Exam  Nursing note and vitals reviewed. Constitutional: She is oriented to person, place, and time. She appears well-developed and well-nourished. No distress.  HENT:  Head: Normocephalic and atraumatic.  Right Ear: Hearing, tympanic membrane, external ear and ear canal normal.  Left Ear: Hearing, tympanic membrane, external ear and ear canal normal.  Nose:    Mouth/Throat: Oropharynx is clear and moist.  Area outline is where patient has 5-6 small vesicles, 1 shallow ulcer, erythema and moderate STS.  Eyes: EOM and lids are normal. Pupils are equal, round, and reactive to light. Right conjunctiva is not injected. Left conjunctiva is injected. Left conjunctiva has no hemorrhage.  Slit lamp exam:      The left eye shows fluorescein  uptake.  No direct or consensual photophobia. +small 1 mm ulcer at medial cornea at 9 o'clock position at border of iris.  Cardiovascular: Normal rate.   Pulmonary/Chest: Effort normal.  Neurological: She is alert and oriented to person, place, and time.  Skin: Skin is warm and dry. Rash noted.  Psychiatric: She has a normal mood and affect. Her behavior is normal.    ED Course  Procedures (including critical care time) Labs Review Labs Reviewed - No data to display Imaging Review No results found.   MDM   1. Herpes simplex conjunctivitis   Case discussed with opthomologist on call (Dr. Janora Norlander). Advised prescribing patient acyclovir 400 mg po 5 x day x 7 days and Viroptic 1 gtt to left eye QID x 7 days. Advised he (Dr. Baird Cancer) would see patient in his office tomorrow for follow up. Patient to call office tomorrow morning for follow up appointment.    [place], [place] 04/20/13 2034

## 2013-04-20 NOTE — Discharge Instructions (Signed)
Please contact Dr. Baird Cancer at his office tomorrow for follow up appointment tomorrow. Medications as prescribed. Ibuprofen as directed on packaging for pain.

## 2013-04-20 NOTE — ED Notes (Signed)
Woke up with red L eye.  No pain.  It watery and drained some white discharge.  No vision problem.

## 2013-05-02 ENCOUNTER — Other Ambulatory Visit (HOSPITAL_COMMUNITY): Payer: Self-pay | Admitting: Internal Medicine

## 2013-05-02 DIAGNOSIS — Z1231 Encounter for screening mammogram for malignant neoplasm of breast: Secondary | ICD-10-CM

## 2013-05-03 ENCOUNTER — Ambulatory Visit (HOSPITAL_COMMUNITY)
Admission: RE | Admit: 2013-05-03 | Discharge: 2013-05-03 | Disposition: A | Discharge status: Home / Self Care | Payer: BC Managed Care – PPO | Source: Ambulatory Visit | Attending: Internal Medicine | Admitting: Internal Medicine

## 2013-05-03 DIAGNOSIS — Z1231 Encounter for screening mammogram for malignant neoplasm of breast: Secondary | ICD-10-CM

## 2013-11-14 ENCOUNTER — Encounter (HOSPITAL_COMMUNITY): Payer: Self-pay | Admitting: Emergency Medicine

## 2014-01-15 IMAGING — CR DG FOOT COMPLETE 3+V*L*
3 series · 3 of 3 positions shown · non-contrast
Comparison: None.

CLINICAL DATA: Nonhealing puncture wound involving the plantar
surface of the foot.  History of stepping on something 2 months
previously.

LEFT FOOT - COMPLETE 3+ VIEW

[x foot ap left]
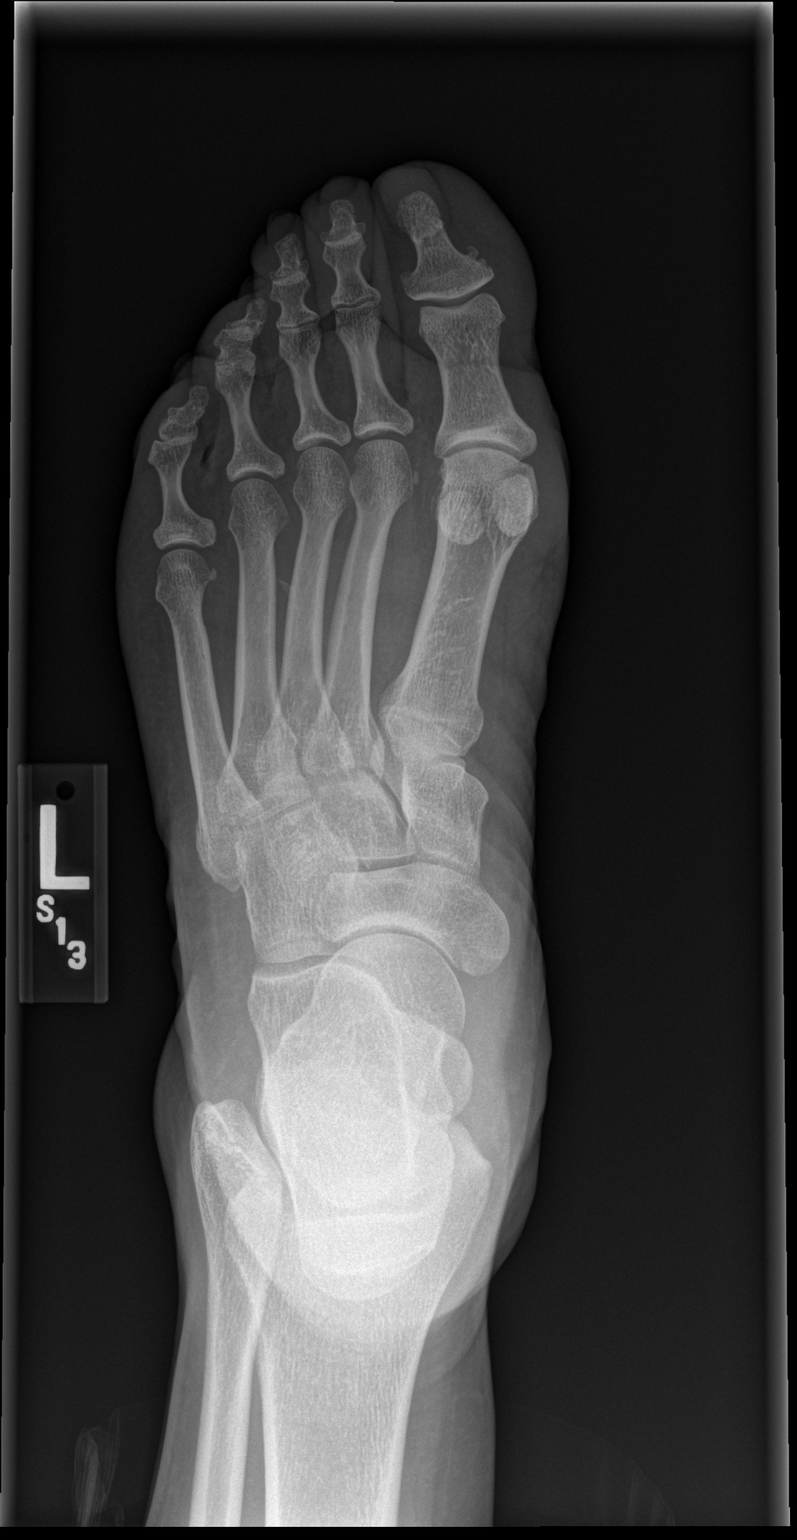

[x foot obl left]
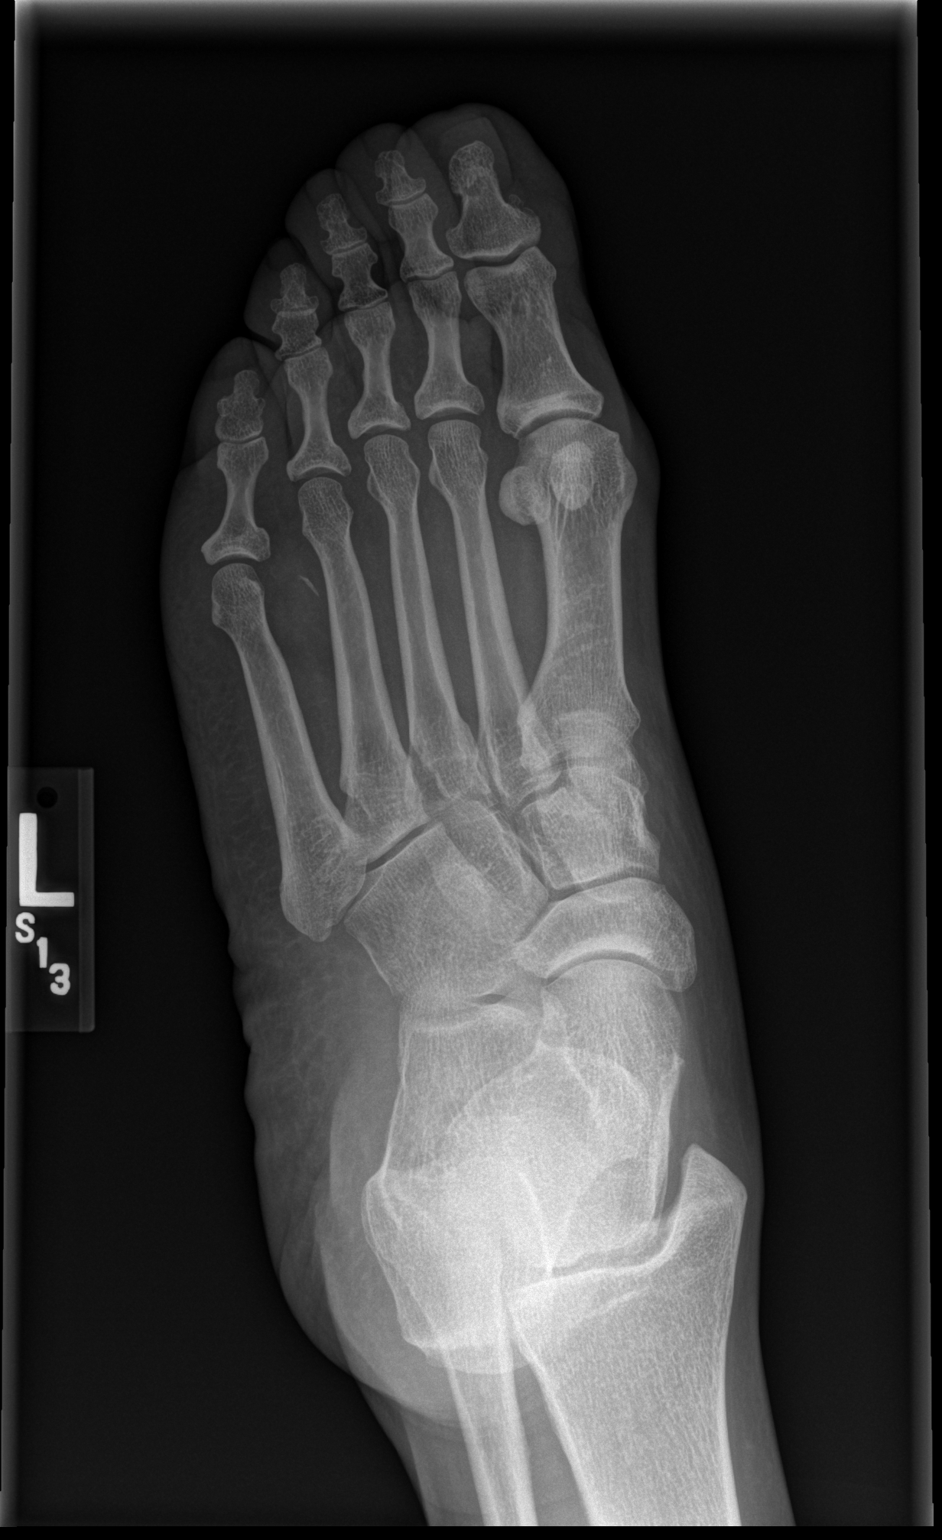

[x foot lat left]
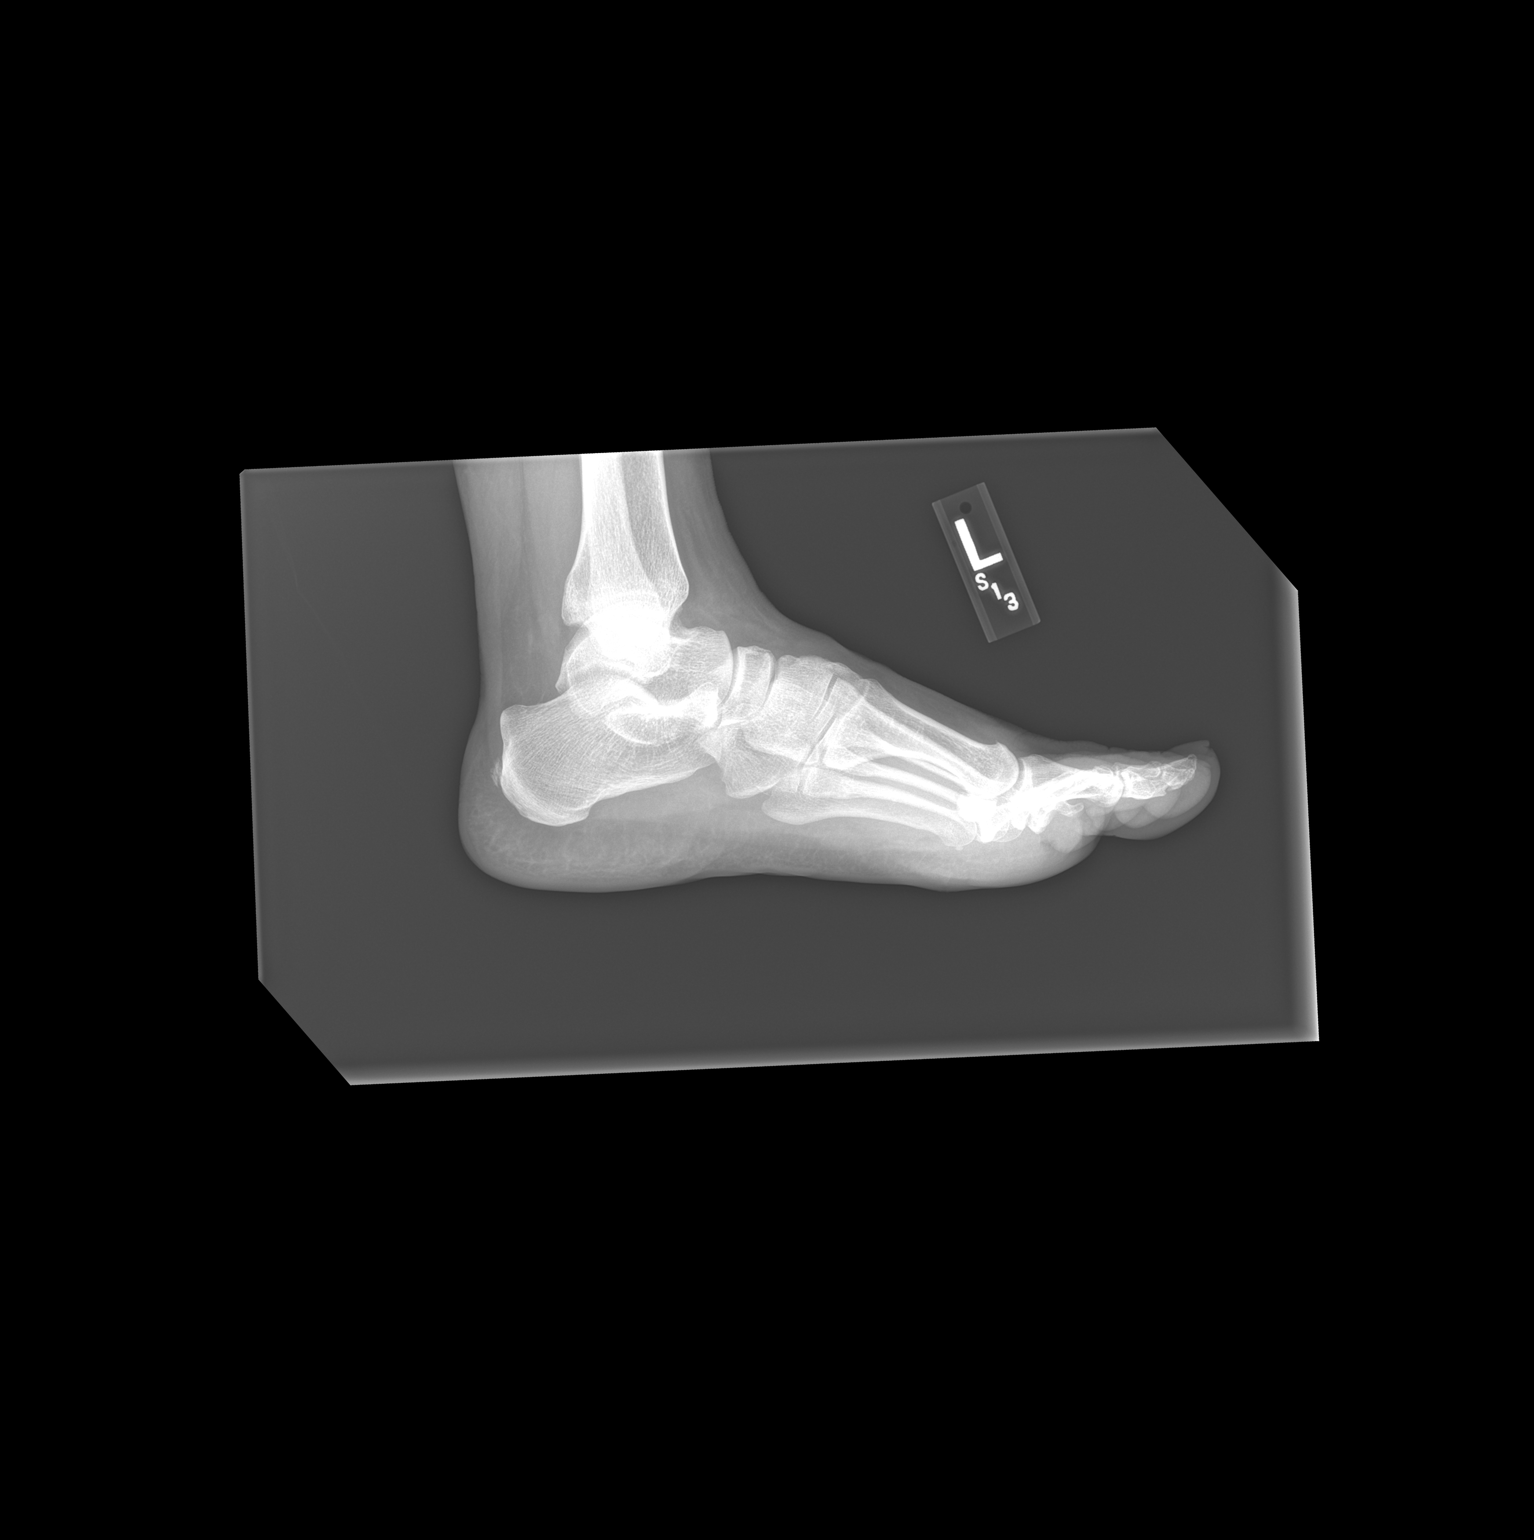

[3 of 3 positions shown; findings below may reference images not displayed]

FINDINGS: There is a small opaque density projecting between the
distal diaphyses of the fourth and fifth metatarsals on the oblique
view and between the third and fourth metatarsals on the frontal
view.  This may reflect a small opaque foreign body within the soft
tissues.  The density cannot be definitely visualized on the
lateral image.  No fracture or bony destruction is seen.  There is
minimal spurring of the distal dorsal aspect of the first
metatarsal.  There is calcification in the distal Achilles tendon
area.
IMPRESSION: Small opaque density projecting between the distal diaphyses of the
fourth and fifth metatarsals on the oblique view and between the
third and fourth metatarsals on the frontal view.  This may reflect
a small opaque foreign body within the soft tissues.  The density
cannot be definitely visualized on the lateral image.

## 2014-11-03 ENCOUNTER — Emergency Department (HOSPITAL_COMMUNITY)
Admission: EM | Admit: 2014-11-03 | Discharge: 2014-11-03 | Disposition: A | Discharge status: Home / Self Care | Payer: Self-pay | Attending: Emergency Medicine | Admitting: Emergency Medicine

## 2014-11-03 ENCOUNTER — Encounter (HOSPITAL_COMMUNITY): Payer: Self-pay | Admitting: Cardiology

## 2014-11-03 DIAGNOSIS — Z8742 Personal history of other diseases of the female genital tract: Secondary | ICD-10-CM | POA: Insufficient documentation

## 2014-11-03 DIAGNOSIS — Z87891 Personal history of nicotine dependence: Secondary | ICD-10-CM | POA: Insufficient documentation

## 2014-11-03 DIAGNOSIS — Z3202 Encounter for pregnancy test, result negative: Secondary | ICD-10-CM | POA: Insufficient documentation

## 2014-11-03 DIAGNOSIS — R0789 Other chest pain: Secondary | ICD-10-CM | POA: Insufficient documentation

## 2014-11-03 DIAGNOSIS — Z86018 Personal history of other benign neoplasm: Secondary | ICD-10-CM | POA: Insufficient documentation

## 2014-11-03 LAB — URINALYSIS, ROUTINE W REFLEX MICROSCOPIC
BILIRUBIN URINE: NEGATIVE
Glucose, UA: NEGATIVE mg/dL
HGB URINE DIPSTICK: NEGATIVE
Ketones, ur: NEGATIVE mg/dL
Nitrite: NEGATIVE
PROTEIN: NEGATIVE mg/dL
Specific Gravity, Urine: 1.016 (ref 1.005–1.030)
UROBILINOGEN UA: 0.2 mg/dL (ref 0.0–1.0)
pH: 6 (ref 5.0–8.0)

## 2014-11-03 LAB — COMPREHENSIVE METABOLIC PANEL
ALBUMIN: 3.7 g/dL (ref 3.5–5.0)
ALK PHOS: 50 U/L (ref 38–126)
ALT: 14 U/L (ref 14–54)
ANION GAP: 7 (ref 5–15)
AST: 18 U/L (ref 15–41)
BILIRUBIN TOTAL: 0.4 mg/dL (ref 0.3–1.2)
BUN: 8 mg/dL (ref 6–20)
CALCIUM: 9.2 mg/dL (ref 8.9–10.3)
CO2: 26 mmol/L (ref 22–32)
Chloride: 104 mmol/L (ref 101–111)
Creatinine, Ser: 0.72 mg/dL (ref 0.44–1.00)
GFR calc Af Amer: >60 mL/min (ref >60)
GLUCOSE: 99 mg/dL (ref 65–99)
Potassium: 3.8 mmol/L (ref 3.5–5.1)
Sodium: 137 mmol/L (ref 135–145)
TOTAL PROTEIN: 7.1 g/dL (ref 6.5–8.1)

## 2014-11-03 LAB — CBC
HCT: 36 % (ref 36.0–46.0)
HEMOGLOBIN: 12.5 g/dL (ref 12.0–15.0)
MCH: 25.5 pg — ABNORMAL LOW (ref 26.0–34.0)
MCHC: 34.7 g/dL (ref 30.0–36.0)
MCV: 73.5 fL — ABNORMAL LOW (ref 78.0–100.0)
Platelets: 150 10*3/uL (ref 150–400)
RBC: 4.9 MIL/uL (ref 3.87–5.11)
RDW: 14.7 % (ref 11.5–15.5)
WBC: 6 10*3/uL (ref 4.0–10.5)

## 2014-11-03 LAB — URINE MICROSCOPIC-ADD ON

## 2014-11-03 LAB — LIPASE, BLOOD: LIPASE: 21 U/L (ref 11–51)

## 2014-11-03 LAB — POC URINE PREG, ED: PREG TEST UR: NEGATIVE

## 2014-11-03 MED ORDER — NAPROXEN 500 MG PO TABS: 500.0000 mg | ORAL_TABLET | Freq: Two times a day (BID) | ORAL | Status: DC

## 2014-11-03 NOTE — ED Notes (Signed)
Reports pain in her left side for the past couple of days. Also recently had pain in her neck but that has resolved. No nausea/vomiting.

## 2014-11-03 NOTE — Discharge Instructions (Signed)
Please read and follow all provided instructions.  Your diagnoses today include:  1. Chest wall pain     Tests performed today include:  An EKG of your heart - normal  A chest x-ray  Cardiac enzymes - a blood test for heart muscle damage  Blood counts and electrolytes  Vital signs. See below for your results today.   Medications prescribed:   Take any prescribed medications only as directed.  Follow-up instructions: Please follow-up with your primary care provider as soon as you can for further evaluation of your symptoms.   Return instructions:  SEEK IMMEDIATE MEDICAL ATTENTION IF:  You have severe chest pain, especially if the pain is crushing or pressure-like and spreads to the arms, back, neck, or jaw, or if you have sweating, nausea (feeling sick to your stomach), or shortness of breath. THIS IS AN EMERGENCY. Don't wait to see if the pain will go away. Get medical help at once. Call 911 or 0 (operator). DO NOT drive yourself to the hospital.   Your chest pain gets worse and does not go away with rest.   You have an attack of chest pain lasting longer than usual, despite rest and treatment with the medications your caregiver has prescribed.   You wake from sleep with chest pain or shortness of breath.  You feel dizzy or faint.  You have chest pain not typical of your usual pain for which you originally saw your caregiver.   You have any other emergent concerns regarding your health.  Additional Information: Chest pain comes from many different causes. Your caregiver has diagnosed you as having chest pain that is not specific for one problem, but does not require admission.  You are at low risk for an acute heart condition or other serious illness.   Your vital signs today were: BP 138/83 mmHg   Pulse 63   Temp(Src) 98.1 F (36.7 C) (Oral)   Resp 15   Ht 5\' 5"  (1.651 m)   Wt 200 lb 12.8 oz (91.082 kg)   BMI 33.41 kg/m2   SpO2 98% If your blood pressure (BP) was  elevated above 135/85 this visit, please have this repeated by your doctor within one month. --------------

## 2014-11-03 NOTE — ED Notes (Signed)
PA at bedside.

## 2014-11-03 NOTE — ED Provider Notes (Signed)
CSN: [id number]     Arrival date & time 11/03/14  1129 History   First MD Initiated Contact with Patient 11/03/14 1204     Chief Complaint  Patient presents with  . Flank Pain     (Consider location/radiation/quality/duration/timing/severity/associated sxs/prior Treatment) HPI Comments: Patient presents with complaint of left-sided flank pain over the lower aspect of her ribs for the past 3 days. Pain is dull in nature, nonradiating and worse with movement and palpation. Patient has not had associated fever, chest pain, cough, shortness of breath, abdominal pain, nausea, vomiting, diarrhea, urinary symptoms. No blood in her stool. No shortness of breath with activity. Patient does note that last week there was a period of time when she had a throbbing pain in her shoulder with associated paresthesias of her left arm and left face. No weakness, slurred speech, facial droop, trouble walking, or trouble talking. Patient denies cardiac risk factors. She states that she had a cardiac catheterization 21 years ago that was negative. No treatments prior to arrival. Onset of symptoms acute. Course is constant.  Patient is a 49 y.o. female presenting with flank pain. The history is provided by the patient.  Flank Pain Associated symptoms include neck pain (resolved). Pertinent negatives include no abdominal pain, chest pain, coughing, diaphoresis, fever, nausea, rash or vomiting.    Past Medical History  Diagnosis Date  . DUB (dysfunctional uterine bleeding)   . Bilateral ovarian cysts   . Fibroid, uterus    Past Surgical History  Procedure Laterality Date  . Tubal ligation  2001   Family History  Problem Relation Age of Onset  . Diabetes Mother   . Heart disease Mother   . Hypertension Mother   . Cancer Maternal Aunt   . Hyperthyroidism Brother   . Hypertension Brother    Social History  Substance Use Topics  . Smoking status: Former Smoker    Types: Cigarettes    Quit date:  01/28/1997  . Smokeless tobacco: None  . Alcohol Use: Yes     Comment: social   OB History    Gravida Para Term Preterm AB TAB SAB Ectopic Multiple Living   2 2 2       2      Review of Systems  Constitutional: Negative for fever and diaphoresis.  Eyes: Negative for redness.  Respiratory: Negative for cough and shortness of breath.   Cardiovascular: Negative for chest pain, palpitations and leg swelling.  Gastrointestinal: Negative for nausea, vomiting and abdominal pain.  Genitourinary: Positive for flank pain. Negative for dysuria.  Musculoskeletal: Positive for neck pain (resolved). Negative for back pain.  Skin: Negative for rash.  Neurological: Negative for syncope and light-headedness.  Psychiatric/Behavioral: The patient is not nervous/anxious.      Allergies  Review of patient's allergies indicates no known allergies.  Home Medications   Prior to Admission medications   Medication Sig Start Date End Date Taking? Authorizing Provider  acetaminophen (TYLENOL) 500 MG tablet Take 500 mg by mouth every 6 (six) hours as needed. Pain or fever    Historical Provider, MD  acyclovir (ZOVIRAX) 400 MG tablet Take 1 tablet (400 mg total) by mouth 5 (five) times daily. X 7 days 04/20/13   Audelia Hives Presson, PA  aspirin EC 81 MG tablet Take 81 mg by mouth daily.    Historical Provider, MD  cyclobenzaprine (FLEXERIL) 10 MG tablet Take 1 tablet (10 mg total) by mouth 2 (two) times daily as needed for muscle spasms. 09/17/12  Adlih Moreno-Coll, MD  ibuprofen (ADVIL,MOTRIN) 600 MG tablet Take 1 tablet (600 mg total) by mouth every 8 (eight) hours as needed for pain. Take with food 09/17/12   Randa Spike, MD  Multiple Vitamin (MULTIVITAMIN WITH MINERALS) TABS Take 1 tablet by mouth daily.    Historical Provider, MD  traMADol (ULTRAM) 50 MG tablet Take 1 tablet (50 mg total) by mouth every 6 (six) hours as needed for pain. 09/17/12   Adlih Moreno-Coll, MD  triamcinolone ointment (KENALOG)  0.1 % Apply topically 2 (two) times daily. 09/17/12   Adlih Moreno-Coll, MD  trifluridine (VIROPTIC) 1 % ophthalmic solution Place 1 drop into the left eye every 6 (six) hours. X 7 days 04/20/13   Annett Gula H Presson, PA   BP 152/102 mmHg  Pulse 71  Temp(Src) 98.1 F (36.7 C) (Oral)  Resp 17  Ht 5\' 5"  (1.651 m)  Wt 200 lb 12.8 oz (91.082 kg)  BMI 33.41 kg/m2  SpO2 100%   Physical Exam  Constitutional: She appears well-developed and well-nourished.  HENT:  Head: Normocephalic and atraumatic.  Mouth/Throat: Mucous membranes are normal. Mucous membranes are not dry.  Eyes: Conjunctivae are normal.  Neck: Trachea normal and normal range of motion. Neck supple. Normal carotid pulses and no JVD present. No muscular tenderness present. Carotid bruit is not present. No tracheal deviation present.  Cardiovascular: Normal rate, regular rhythm, S1 normal, S2 normal, normal heart sounds and intact distal pulses.  Exam reveals no decreased pulses.   No murmur heard. Pulmonary/Chest: Effort normal. No respiratory distress. She has no wheezes. She exhibits tenderness.  Patient with tenderness to palpation, mild, 2 left lateral inferior ribs.  Abdominal: Soft. Normal aorta and bowel sounds are normal. There is no tenderness. There is no rebound and no guarding.  Musculoskeletal: Normal range of motion.  Neurological: She is alert.  Skin: Skin is warm and dry. She is not diaphoretic. No cyanosis. No pallor.  Psychiatric: She has a normal mood and affect.  Nursing note and vitals reviewed.   ED Course  Procedures (including critical care time) Labs Review Labs Reviewed  CBC - Abnormal; Notable for the following:    MCV 73.5 (*)    MCH 25.5 (*)    All other components within normal limits  URINALYSIS, ROUTINE W REFLEX MICROSCOPIC (NOT AT [facility]) - Abnormal; Notable for the following:    Leukocytes, UA SMALL (*)    All other components within normal limits  URINE MICROSCOPIC-ADD ON - Abnormal;  Notable for the following:    Squamous Epithelial / LPF FEW (*)    Bacteria, UA FEW (*)    All other components within normal limits  LIPASE, BLOOD  COMPREHENSIVE METABOLIC PANEL  POC URINE PREG, ED    Imaging Review No results found. I have personally reviewed and evaluated these images and lab results as part of my medical decision-making.   EKG Interpretation None       ED ECG REPORT   Date: 11/03/2014  Rate: 67  Rhythm: normal sinus rhythm  QRS Axis: normal  Intervals: PR prolonged  ST/T Wave abnormalities: normal and nonspecific T wave changes  Conduction Disutrbances:none  Narrative Interpretation:   Old EKG Reviewed: unchanged  I have personally reviewed the EKG tracing and agree with the computerized printout as noted.   12:27 PM Patient seen and examined. Work-up initiated. Medications ordered.   Vital signs reviewed and are as follows: BP 152/102 mmHg  Pulse 71  Temp(Src) 98.1 F (36.7 C) (  Oral)  Resp 17  Ht 5\' 5"  (1.651 m)  Wt 200 lb 12.8 oz (91.082 kg)  BMI 33.41 kg/m2  SpO2 100%  3:00 PM Patient updated on results.   Will d/c to home with naproxen.   Patient was counseled to return with severe chest pain, especially if the pain is crushing or pressure-like and spreads to the arms, back, neck, or jaw, or if they have sweating, nausea, or shortness of breath with the pain. They were encouraged to call 911 with these symptoms.   They were also told to return if their chest pain gets worse and does not go away with rest, they have an attack of chest pain lasting longer than usual despite rest and treatment with the medications their caregiver has prescribed, if they wake from sleep with chest pain or shortness of breath, if they feel dizzy or faint, if they have chest pain not typical of their usual pain, or if they have any other emergent concerns regarding their health.  The patient was urged to return to the Emergency Department immediately with  worsening of current symptoms, worsening abdominal pain, persistent vomiting, blood noted in stools, fever, or any other concerns. The patient verbalized understanding.   The patient verbalized understanding and agreed.   MDM   Final diagnoses:  Chest wall pain   Patient with pain over the left inferior lateral ribs which is mild, not associated with trauma, worse with palpation or movement. Urine does not demonstrate UTI. Labs are otherwise reassuring. EKG with T wave changes but is unchanged from previous. Patient has no chest pain or shortness of breath. Do not suspect rib fracture or pneumothorax. Do not feel that patient needs imaging at this time. No risk factors for PE.    Carlisle Cater, PA-C 11/03/14 [place], MD 11/03/14 1511  Orpah Greek, MD 11/03/14 1515

## 2015-04-24 ENCOUNTER — Other Ambulatory Visit: Payer: Self-pay

## 2015-04-24 DIAGNOSIS — Z1231 Encounter for screening mammogram for malignant neoplasm of breast: Secondary | ICD-10-CM

## 2015-05-23 ENCOUNTER — Ambulatory Visit (INDEPENDENT_AMBULATORY_CARE_PROVIDER_SITE_OTHER): Payer: BLUE CROSS/BLUE SHIELD | Admitting: Obstetrics & Gynecology

## 2015-05-23 ENCOUNTER — Ambulatory Visit
Admission: RE | Admit: 2015-05-23 | Discharge: 2015-05-23 | Disposition: A | Discharge status: Home / Self Care | Payer: BLUE CROSS/BLUE SHIELD | Source: Ambulatory Visit

## 2015-05-23 VITALS — BP 138/95 | HR 76 | Temp 98.4°F | Resp 18 | Ht 65.0 in | Wt 198.9 lb

## 2015-05-23 DIAGNOSIS — Z1231 Encounter for screening mammogram for malignant neoplasm of breast: Secondary | ICD-10-CM

## 2015-05-23 DIAGNOSIS — Z01419 Encounter for gynecological examination (general) (routine) without abnormal findings: Secondary | ICD-10-CM

## 2015-05-23 DIAGNOSIS — Z1151 Encounter for screening for human papillomavirus (HPV): Secondary | ICD-10-CM | POA: Diagnosis not present

## 2015-05-23 DIAGNOSIS — Z124 Encounter for screening for malignant neoplasm of cervix: Secondary | ICD-10-CM

## 2015-05-23 NOTE — Progress Notes (Signed)
Patient ID: Erin Mcdaniel, female   DOB: [birth year], 50 y.o.   MRN: [id number]  Chief Complaint  Patient presents with  . Gynecologic Exam    HPI Erin Mcdaniel is a 50 y.o. female.  [id number] No LMP recorded. Patient is not currently having periods (Reason: Perimenopausal). 2 years postmenopause, for annual exam. Rare VMS noted.   HPI  Past Medical History  Diagnosis Date  . DUB (dysfunctional uterine bleeding)   . Bilateral ovarian cysts   . Fibroid, uterus     Past Surgical History  Procedure Laterality Date  . Tubal ligation  2001    Family History  Problem Relation Age of Onset  . Diabetes Mother   . Heart disease Mother   . Hypertension Mother   . Cancer Maternal Aunt   . Hyperthyroidism Brother   . Hypertension Brother     Social History Social History  Substance Use Topics  . Smoking status: Former Smoker    Types: Cigarettes    Quit date: 01/28/1997  . Smokeless tobacco: Not on file  . Alcohol Use: Yes     Comment: social    No Known Allergies  Current Outpatient Prescriptions  Medication Sig Dispense Refill  . acetaminophen (TYLENOL) 500 MG tablet Take 500 mg by mouth every 6 (six) hours as needed. Pain or fever    . aspirin EC 81 MG tablet Take 81 mg by mouth daily.    Marland Kitchen acyclovir (ZOVIRAX) 400 MG tablet Take 1 tablet (400 mg total) by mouth 5 (five) times daily. X 7 days (Patient not taking: Reported on 05/23/2015) 35 tablet 0  . cyclobenzaprine (FLEXERIL) 10 MG tablet Take 1 tablet (10 mg total) by mouth 2 (two) times daily as needed for muscle spasms. (Patient not taking: Reported on 05/23/2015) 20 tablet 0  . Multiple Vitamin (MULTIVITAMIN WITH MINERALS) TABS Take 1 tablet by mouth daily. Reported on 05/23/2015    . naproxen (NAPROSYN) 500 MG tablet Take 1 tablet (500 mg total) by mouth 2 (two) times daily. (Patient not taking: Reported on 05/23/2015) 20 tablet 0  . traMADol (ULTRAM) 50 MG tablet Take 1 tablet (50 mg total) by mouth every 6 (six) hours as  needed for pain. (Patient not taking: Reported on 05/23/2015) 15 tablet 0  . triamcinolone ointment (KENALOG) 0.1 % Apply topically 2 (two) times daily. (Patient not taking: Reported on 05/23/2015) 30 g 0  . trifluridine (VIROPTIC) 1 % ophthalmic solution Place 1 drop into the left eye every 6 (six) hours. X 7 days (Patient not taking: Reported on 05/23/2015) 7.5 mL 0  . [DISCONTINUED] hydrochlorothiazide (HYDRODIURIL) 12.5 MG tablet Take 2 tablets (25 mg total) by mouth daily. 30 tablet 3   No current facility-administered medications for this visit.    Review of Systems Review of Systems  Constitutional: Negative.   Endocrine: Negative for cold intolerance.       Occasional VMS  Genitourinary: Negative for vaginal bleeding, vaginal discharge and menstrual problem.  Neurological: Positive for headaches.    Blood pressure 138/95, pulse 76, temperature 98.4 F (36.9 C), temperature source Oral, resp. rate 18, height 5\' 5"  (1.651 m), weight 198 lb 14.4 oz (90.22 kg), SpO2 100 %.  Physical Exam Physical Exam  Constitutional: She is oriented to person, place, and time. She appears well-developed. No distress.  Cardiovascular: Normal rate.   Pulmonary/Chest: Effort normal. No respiratory distress.  Breasts: breasts appear normal, no suspicious masses, no skin or nipple changes or axillary nodes.   Abdominal: Soft.  There is no tenderness.  Genitourinary: Vagina normal and uterus normal. No vaginal discharge found.  No mass, pap sent  Neurological: She is alert and oriented to person, place, and time.  Skin: Skin is warm and dry.  Psychiatric: She has a normal mood and affect. Her behavior is normal.  Vitals reviewed.   Data Reviewed Mammogram negative today Pap 2013 VS Assessment    Well woman exam, postmenopausal  Elevated BP     Plan    Pap sent Mammogram yearly F/U with PCP for evaluation of HTN. Weight loss and exercise encouraged Breast self exam discussed         Dennette Faulconer 05/23/2015, 2:08 PM

## 2015-05-23 NOTE — Patient Instructions (Signed)
Mammogram A mammogram is an X-ray of the breasts that is done to check for abnormal changes. This procedure can screen for and detect any changes that may suggest breast cancer. A mammogram can also identify other changes and variations in the breast, such as:  Inflammation of the breast tissue (mastitis).  An infected area that contains a collection of pus (abscess).  A fluid-filled sac (cyst).  Fibrocystic changes. This is when breast tissue becomes denser, which can make the tissue feel rope-like or uneven under the skin.  Tumors that are not cancerous (benign). LET YOUR HEALTH CARE PROVIDER KNOW ABOUT:  Any allergies you have.  If you have breast implants.  If you have had previous breast disease, biopsy, or surgery.  If you are breastfeeding.  Any possibility that you could be pregnant, if this applies.  If you are younger than age 25.  If you have a family history of breast cancer. RISKS AND COMPLICATIONS Generally, this is a safe procedure. However, problems may occur, including:  Exposure to radiation. Radiation levels are very low with this test.  The results being misinterpreted.  The need for further tests.  The inability of the mammogram to detect certain cancers. BEFORE THE PROCEDURE  Schedule your test about 1-2 weeks after your menstrual period. This is usually when your breasts are the least tender.  If you have had a mammogram done at a different facility in the past, get the mammogram X-rays or have them sent to your current exam facility in order to compare them.  Wash your breasts and under your arms the day of the test.  Do not wear deodorants, perfumes, lotions, or powders anywhere on your body on the day of the test.  Remove any jewelry from your neck.  Wear clothes that you can change into and out of easily. PROCEDURE  You will undress from the waist up and put on a gown.  You will stand in front of the X-ray machine.  Each breast will  be placed between two plastic or glass plates. The plates will compress your breast for a few seconds. Try to stay as relaxed as possible during the procedure. This does not cause any harm to your breasts and any discomfort you feel will be very brief.  X-rays will be taken from different angles of each breast. The procedure may vary among health care providers and hospitals. AFTER THE PROCEDURE  The mammogram will be examined by a specialist (radiologist).  You may need to repeat certain parts of the test, depending on the quality of the images. This is commonly done if the radiologist needs a better view of the breast tissue.  Ask when your test results will be ready. Make sure you get your test results.  You may resume your normal activities.   This information is not intended to replace advice given to you by your health care provider. Make sure you discuss any questions you have with your health care provider.   Document Released: 12/28/1999 Document Revised: 09/20/2014 Document Reviewed: 03/10/2014 Elsevier Interactive Patient Education 2016 [employer]  

## 2015-05-23 NOTE — Progress Notes (Signed)
Patient ID: Erin Mcdaniel, female   DOB: 03-Apr-1965, 50 y.o.   MRN: [id number] Pt reports headache and has a elevated BP of 144/102.  Reassessed pts BP and it was 138/95

## 2015-05-24 LAB — CYTOLOGY - PAP

## 2015-08-22 ENCOUNTER — Encounter (HOSPITAL_COMMUNITY): Payer: Self-pay | Admitting: *Deleted

## 2015-08-22 DIAGNOSIS — M25552 Pain in left hip: Secondary | ICD-10-CM | POA: Diagnosis present

## 2015-08-22 DIAGNOSIS — Z87891 Personal history of nicotine dependence: Secondary | ICD-10-CM | POA: Insufficient documentation

## 2015-08-22 DIAGNOSIS — M5417 Radiculopathy, lumbosacral region: Secondary | ICD-10-CM | POA: Diagnosis not present

## 2015-08-22 DIAGNOSIS — I10 Essential (primary) hypertension: Secondary | ICD-10-CM | POA: Diagnosis not present

## 2015-08-22 NOTE — ED Triage Notes (Signed)
Pt c/o left buttock pain that travels down her leg. States that sometimes her right leg hurts as well. States it feels like the muscle is contracting. States this has been going on for one month.

## 2015-08-23 ENCOUNTER — Emergency Department (HOSPITAL_COMMUNITY)
Admission: EM | Admit: 2015-08-23 | Discharge: 2015-08-23 | Disposition: A | Discharge status: Home / Self Care | Payer: BLUE CROSS/BLUE SHIELD | Attending: Emergency Medicine | Admitting: Emergency Medicine

## 2015-08-23 DIAGNOSIS — M5417 Radiculopathy, lumbosacral region: Secondary | ICD-10-CM

## 2015-08-23 MED ORDER — CYCLOBENZAPRINE HCL 10 MG PO TABS: 10.0000 mg | ORAL_TABLET | Freq: Two times a day (BID) | ORAL | 0 refills | Status: AC | PRN

## 2015-08-23 NOTE — ED Provider Notes (Signed)
[facility] DEPT Provider Note   CSN: [id number] Arrival date & time: 08/22/15  2022  By signing my name below, I, Irene Pap, attest that this documentation has been prepared under the direction and in the presence of Ripley Fraise, MD. Electronically Signed: Irene Pap, ED Scribe. 08/23/15. 2:40 AM.  First Provider Contact:  None   History   Chief Complaint Chief Complaint  Patient presents with  . Hip Pain   The history is provided by the patient. No language interpreter was used.  Hip Pain  This is a new problem. The current episode started more than 1 week ago. The problem occurs constantly. The problem has been gradually worsening. Pertinent negatives include no chest pain, no abdominal pain and no shortness of breath. The symptoms are aggravated by bending. Treatments tried: ibuprofen. The treatment provided no relief.  HPI Comments: Stevi Calk is a 50 y.o. Female with a hx of herniated disc who presents to the Emergency Department complaining of gradually worsening, tight left hip pain that radiates to the upper left buttock and down the left leg onset one month ago. She states that it "feels like the leg is contracted." She reports worsening pain with lifting the leg and bending at the waist. Pt is able to ambulate.  Pt has additionally been taking ibuprofen for pain to no relief. She denies recent fall, fever, chest pain, SOB, abdominal pain, nausea, vomiting, difficulty urinating, dysuria, bladder or bowel incontinence, numbness, or weakness.   Past Medical History:  Diagnosis Date  . Bilateral ovarian cysts   . DUB (dysfunctional uterine bleeding)   . Fibroid, uterus     Patient Active Problem List   Diagnosis Date Noted  . Routine gynecological examination 03/29/2012  . Hypertension 01/29/2011    Past Surgical History:  Procedure Laterality Date  . TUBAL LIGATION  2001    OB History    Gravida Para Term Preterm AB Living   2 2 2     2    SAB TAB  Ectopic Multiple Live Births                   Home Medications    Prior to Admission medications   Medication Sig Start Date End Date Taking? Authorizing Provider  Vitamin D, Ergocalciferol, (DRISDOL) 50000 units CAPS capsule Take 50,000 Units by mouth every 7 (seven) days. On Saturday   Yes Historical Provider, MD  acyclovir (ZOVIRAX) 400 MG tablet Take 1 tablet (400 mg total) by mouth 5 (five) times daily. X 7 days Patient not taking: Reported on 05/23/2015 04/20/13   Lutricia Feil, PA  cyclobenzaprine (FLEXERIL) 10 MG tablet Take 1 tablet (10 mg total) by mouth 2 (two) times daily as needed for muscle spasms. Patient not taking: Reported on 05/23/2015 09/17/12   Leonette Monarch Moreno-Coll, MD  naproxen (NAPROSYN) 500 MG tablet Take 1 tablet (500 mg total) by mouth 2 (two) times daily. Patient not taking: Reported on 05/23/2015 11/03/14   Carlisle Cater, PA-C  traMADol (ULTRAM) 50 MG tablet Take 1 tablet (50 mg total) by mouth every 6 (six) hours as needed for pain. Patient not taking: Reported on 05/23/2015 09/17/12   Leonette Monarch Moreno-Coll, MD  triamcinolone ointment (KENALOG) 0.1 % Apply topically 2 (two) times daily. Patient not taking: Reported on 05/23/2015 09/17/12   Adlih Moreno-Coll, MD  trifluridine (VIROPTIC) 1 % ophthalmic solution Place 1 drop into the left eye every 6 (six) hours. X 7 days Patient not taking: Reported on 05/23/2015 04/20/13  Lutricia Feil, PA    Family History Family History  Problem Relation Age of Onset  . Diabetes Mother   . Heart disease Mother   . Hypertension Mother   . Hyperthyroidism Brother   . Hypertension Brother   . Cancer Maternal Aunt     Social History Social History  Substance Use Topics  . Smoking status: Former Smoker    Types: Cigarettes    Quit date: 01/28/1997  . Smokeless tobacco: Not on file  . Alcohol use Yes     Comment: social     Allergies   Review of patient's allergies indicates no known allergies.   Review of  Systems Review of Systems  Constitutional: Negative for fever.  Respiratory: Negative for shortness of breath.   Cardiovascular: Positive for leg swelling. Negative for chest pain.  Gastrointestinal: Negative for abdominal pain, nausea and vomiting.  Genitourinary: Negative for difficulty urinating and dysuria.  Musculoskeletal: Positive for arthralgias.  Neurological: Negative for weakness and numbness.  All other systems reviewed and are negative.    Physical Exam Updated Vital Signs BP 144/91 (BP Location: Right Arm)   Pulse 65   Temp 98.3 F (36.8 C) (Oral)   Resp 16   SpO2 97%   Physical Exam CONSTITUTIONAL: Well developed/well nourished HEAD: Normocephalic/atraumatic EYES: EOMI/PERRL ENMT: Mucous membranes moist NECK: supple no meningeal signs SPINE/BACK:tenderness to lower lumbar spine, No bruising/crepitance/stepoffs noted to spine CV: S1/S2 noted, no murmurs/rubs/gallops noted LUNGS: Lungs are clear to auscultation bilaterally, no apparent distress ABDOMEN: soft, nontender, no rebound or guarding GU:no cva tenderness NEURO: Awake/alert, equal motor 5/5 strength noted with the following: hip flexion/knee flexion/extension, foot dorsi/plantar flexion, great toe extension intact bilaterally, no clonus bilaterally, plantar reflex appropriate (toes downgoing), no sensory deficit in any dermatome.  Equal patellar/achilles reflex noted (2+) in bilateral lower extremities.  Pt is able to ambulate unassisted. EXTREMITIES: pulses normal, full ROM of left hip without difficulty, no deformity SKIN: warm, color normal PSYCH: no abnormalities of mood noted, alert and oriented to situation     ED Treatments / Results  DIAGNOSTIC STUDIES: Oxygen Saturation is 97% on RA, normal by my interpretation.    COORDINATION OF CARE: 2:32 AM-Discussed treatment plan which includes Flexeril and referral to spine specialist with pt at bedside and pt agreed to plan.    Labs (all labs  ordered are listed, but only abnormal results are displayed) Labs Reviewed - No data to display  EKG  EKG Interpretation None       Radiology No results found.  Procedures Procedures (including critical care time)  Medications Ordered in ED Medications - No data to display   Initial Impression / Assessment and Plan / ED Course  I have reviewed the triage vital signs and the nursing notes.  Pertinent labs & imaging results that were available during my care of the patient were reviewed by me and considered in my medical decision making (see chart for details).  Clinical Course    Pt with probable lumbosacral radiculopathy with pain No focal motor deficit I feel she is stable for d/c home We discussed return precautions She declines pain meds Referred to spine/nsgy  Final Clinical Impressions(s) / ED Diagnoses   Final diagnoses:  Lumbosacral radiculopathy  I personally performed the services described in this documentation, which was scribed in my presence. The recorded information has been reviewed and is accurate.       New Prescriptions Discharge Medication List as of 08/23/2015  2:44 AM  Ripley Fraise, MD 08/23/15 [phone]

## 2015-08-23 NOTE — ED Notes (Signed)
Pt departed in NAD.  

## 2015-08-23 NOTE — Discharge Instructions (Signed)

## 2015-08-24 ENCOUNTER — Emergency Department (HOSPITAL_COMMUNITY)
Admission: EM | Admit: 2015-08-24 | Discharge: 2015-08-24 | Disposition: A | Discharge status: Home / Self Care | Payer: BLUE CROSS/BLUE SHIELD | Attending: Emergency Medicine | Admitting: Emergency Medicine

## 2015-08-24 ENCOUNTER — Encounter (HOSPITAL_COMMUNITY): Payer: Self-pay | Admitting: Emergency Medicine

## 2015-08-24 DIAGNOSIS — X58XXXD Exposure to other specified factors, subsequent encounter: Secondary | ICD-10-CM | POA: Insufficient documentation

## 2015-08-24 DIAGNOSIS — Z79899 Other long term (current) drug therapy: Secondary | ICD-10-CM | POA: Diagnosis not present

## 2015-08-24 DIAGNOSIS — I10 Essential (primary) hypertension: Secondary | ICD-10-CM | POA: Insufficient documentation

## 2015-08-24 DIAGNOSIS — S39012D Strain of muscle, fascia and tendon of lower back, subsequent encounter: Secondary | ICD-10-CM | POA: Insufficient documentation

## 2015-08-24 DIAGNOSIS — S3992XD Unspecified injury of lower back, subsequent encounter: Secondary | ICD-10-CM | POA: Diagnosis present

## 2015-08-24 DIAGNOSIS — Z87891 Personal history of nicotine dependence: Secondary | ICD-10-CM | POA: Insufficient documentation

## 2015-08-24 DIAGNOSIS — M5417 Radiculopathy, lumbosacral region: Secondary | ICD-10-CM | POA: Insufficient documentation

## 2015-08-24 NOTE — ED Triage Notes (Signed)
Pt reports left lower back pain radiating down left leg for months; denies GU symptoms.

## 2015-08-24 NOTE — ED Provider Notes (Signed)
[facility] DEPT Provider Note   CSN: [id number] Arrival date & time: 08/24/15  1709  First Provider Contact:  First MD Initiated Contact with Patient 08/24/15 1726    By signing my name below, I, Erin Mcdaniel, attest that this documentation has been prepared under the direction and in the presence of Delrae Rend, PA-C.  Electronically Signed: Reola Mcdaniel, ED Scribe. 08/24/15. 5:31 PM.  History   Chief Complaint Chief Complaint  Patient presents with  . Back Pain   The history is provided by the patient. No language interpreter was used.   HPI Comments: Erin Mcdaniel is a 50 y.o. female who presents to the Emergency Department complaining of gradual onset, gradually worsening, constant, left-sided lower back pain x "for months". Pt notes that her pain is radiating down her left leg. She states that her pain feels "like a contraction" and that her leg and hip feel stiff. She states that she has a hx of back issues, but states that this episode is much more painful than her hx of pain to the area. She has been taking Ibuprofen since the onset of her pain with minimal relief. No hx of recent trauma/injuries/falls. Pt was seen ~1 day PTA in the ED for same, where she was rx'd Flexeril and advised to f/u with a neurosurgeon. Notes that she did not fill or take the rx for Flexeril, and that she attempted to schedule an appointment with a neurosurgeon but states that she cannot see them in their office for another month. Denies numbness, tingling, weakness, fever, chills, bowel/bladder incontinence, or any other associated symptoms.   Past Medical History:  Diagnosis Date  . Bilateral ovarian cysts   . DUB (dysfunctional uterine bleeding)   . Fibroid, uterus    Patient Active Problem List   Diagnosis Date Noted  . Routine gynecological examination 03/29/2012  . Hypertension 01/29/2011   Past Surgical History:  Procedure Laterality Date  . TUBAL LIGATION  2001   OB  History    Gravida Para Term Preterm AB Living   2 2 2     2    SAB TAB Ectopic Multiple Live Births                 Home Medications    Prior to Admission medications   Medication Sig Start Date End Date Taking? Authorizing Provider  cyclobenzaprine (FLEXERIL) 10 MG tablet Take 1 tablet (10 mg total) by mouth 2 (two) times daily as needed for muscle spasms. 08/23/15   Ripley Fraise, MD  Vitamin D, Ergocalciferol, (DRISDOL) 50000 units CAPS capsule Take 50,000 Units by mouth every 7 (seven) days. On Saturday    Historical Provider, MD   Family History Family History  Problem Relation Age of Onset  . Diabetes Mother   . Heart disease Mother   . Hypertension Mother   . Hyperthyroidism Brother   . Hypertension Brother   . Cancer Maternal Aunt    Social History Social History  Substance Use Topics  . Smoking status: Former Smoker    Types: Cigarettes    Quit date: 01/28/1997  . Smokeless tobacco: Not on file  . Alcohol use Yes     Comment: social   Allergies   Review of patient's allergies indicates no known allergies.  Review of Systems Review of Systems A complete 10 system review of systems was obtained and all systems are negative except as noted in the HPI and PMH.   Physical Exam Updated Vital Signs BP  139/93 (BP Location: Left Arm)   Pulse 72   Temp 98.3 F (36.8 C) (Oral)   Resp 18   Ht 5\' 5"  (1.651 m)   Wt 197 lb (89.4 kg)   SpO2 100%   BMI 32.78 kg/m   Physical Exam  Constitutional: She appears well-developed and well-nourished.  HENT:  Head: Normocephalic.  Eyes: Conjunctivae are normal.  Cardiovascular: Normal rate.   Pulmonary/Chest: Effort normal. No respiratory distress.  Abdominal: She exhibits no distension.  Musculoskeletal: Normal range of motion. She exhibits tenderness.  No midline spinal tenderness. Mild left lateral hip and thigh tenderness. 5/5 strength throughout bilateral upper and lower extremities. Ambulates with a steady gait.     Neurological: She is alert.  Skin: Skin is warm and dry.  Psychiatric: She has a normal mood and affect. Her behavior is normal.  Nursing note and vitals reviewed.  ED Treatments / Results  DIAGNOSTIC STUDIES: Oxygen Saturation is 100% on RA, normal by my interpretation.   COORDINATION OF CARE: 5:30 PM-Discussed next steps with pt. Pt verbalized understanding and is agreeable with the plan.   Procedures Procedures (including critical care time)  Medications Ordered in ED Medications - No data to display  Initial Impression / Assessment and Plan / ED Course  I have reviewed the triage vital signs and the nursing notes.  Pertinent labs & imaging results that were available during my care of the patient were reviewed by me and considered in my medical decision making (see chart for details).  Clinical Course   Pt with several weeks of left lumbar back pain that radiates to her left hip and through her left thigh. She has no focal neuro deficits. No red flags for cauda equina. No midline back tenderness. Pt wants an MRI tonight. She wants to find out "what is causing this." She has not tried the flexeril that was prescribed at yesterday's visit. I discussed with pt that an emergent MRI is not indicated at this time. Her pain sounds radicular and I doubt she has an emergent pathology of her spine. I did offer addition of other supportive meds to try at home which she declines. She states she does not want to try the flexeril because she does not like taking medicine. I offered giving her the contact information for other neurosurgeons or orthopedic surgeons in the area which she also declined. She is upset and left the emergency department tonight prior to receiving discharge paperwork. Prior to her leaving I did encourage her to fill and try her flexeril in addition to either ibuprofen or tylenol and we had a long discussion about concerning signs/symptoms that would warrant further emergent  workup.  Final Clinical Impressions(s) / ED Diagnoses   Final diagnoses:  Low back strain, subsequent encounter  Lumbosacral radiculopathy    New Prescriptions Discharge Medication List as of 08/24/2015  5:44 PM     I personally performed the services described in this documentation, which was scribed in my presence. The recorded information has been reviewed and is accurate.    Anne Ng, PA-C 08/24/15 1804    Julianne Rice, MD 08/24/15 [phone]

## 2015-09-10 ENCOUNTER — Other Ambulatory Visit: Payer: Self-pay | Admitting: Neurosurgery

## 2015-09-10 DIAGNOSIS — G8929 Other chronic pain: Secondary | ICD-10-CM

## 2015-09-10 DIAGNOSIS — M5442 Lumbago with sciatica, left side: Principal | ICD-10-CM

## 2015-09-10 DIAGNOSIS — M5441 Lumbago with sciatica, right side: Principal | ICD-10-CM

## 2015-09-16 ENCOUNTER — Ambulatory Visit
Admission: RE | Admit: 2015-09-16 | Discharge: 2015-09-16 | Disposition: A | Discharge status: Home / Self Care | Payer: BLUE CROSS/BLUE SHIELD | Source: Ambulatory Visit | Attending: Neurosurgery | Admitting: Neurosurgery

## 2015-09-16 DIAGNOSIS — M5442 Lumbago with sciatica, left side: Principal | ICD-10-CM

## 2015-09-16 DIAGNOSIS — G8929 Other chronic pain: Secondary | ICD-10-CM

## 2015-09-16 DIAGNOSIS — M5441 Lumbago with sciatica, right side: Principal | ICD-10-CM

## 2015-10-03 ENCOUNTER — Encounter (HOSPITAL_COMMUNITY): Payer: Self-pay | Admitting: Emergency Medicine

## 2015-10-03 ENCOUNTER — Emergency Department (HOSPITAL_COMMUNITY)
Admission: EM | Admit: 2015-10-03 | Discharge: 2015-10-03 | Disposition: A | Discharge status: Home / Self Care | Payer: BLUE CROSS/BLUE SHIELD | Attending: Emergency Medicine | Admitting: Emergency Medicine

## 2015-10-03 ENCOUNTER — Emergency Department (HOSPITAL_COMMUNITY): Payer: BLUE CROSS/BLUE SHIELD

## 2015-10-03 DIAGNOSIS — Z79891 Long term (current) use of opiate analgesic: Secondary | ICD-10-CM | POA: Insufficient documentation

## 2015-10-03 DIAGNOSIS — M25552 Pain in left hip: Secondary | ICD-10-CM | POA: Diagnosis present

## 2015-10-03 DIAGNOSIS — Z79899 Other long term (current) drug therapy: Secondary | ICD-10-CM | POA: Diagnosis not present

## 2015-10-03 DIAGNOSIS — I10 Essential (primary) hypertension: Secondary | ICD-10-CM | POA: Insufficient documentation

## 2015-10-03 DIAGNOSIS — Z87891 Personal history of nicotine dependence: Secondary | ICD-10-CM | POA: Insufficient documentation

## 2015-10-03 DIAGNOSIS — M5442 Lumbago with sciatica, left side: Secondary | ICD-10-CM | POA: Diagnosis not present

## 2015-10-03 MED ORDER — NAPROXEN 500 MG PO TABS
500.0000 mg | ORAL_TABLET | Freq: Once | ORAL | Status: AC
  Administered 2015-10-03: 500 mg via ORAL
  Filled 2015-10-03: qty 1

## 2015-10-03 MED ORDER — ETODOLAC 300 MG PO CAPS: 300.0000 mg | ORAL_CAPSULE | Freq: Three times a day (TID) | ORAL | 0 refills | Status: AC

## 2015-10-03 NOTE — Discharge Instructions (Signed)
Follow up with your neurosurgeon.  Take the medications as needed for pain.  Continue with the plans for physical therapy and possible spinal injections

## 2015-10-03 NOTE — ED Provider Notes (Signed)
[facility] DEPT Provider Note   CSN: [id number] Arrival date & time: 10/03/15  1804     History   Chief Complaint Chief Complaint  Patient presents with  . Back Pain  . Hip Pain    HPI Erin Mcdaniel is a 50 y.o. female.  HPI Patient presents to the emergency room with complaints of persistent pain in her lower back, left hip and left leg. Symptoms started several weeks to couple months ago. She has been seen in the emergency room twice for this. She was referred to neurosurgery and had an MRI of her lumbar spine. Patient was told she had spinal stenosis associated with impingement.  Patient is concerned because she started having pain in her left hip. It seems to radiate from her lower back around towards the anterior aspect of her iliac crest. She started developing numbness and tingling in her left lower extremity down towards her thigh and knee. This concerned her this evening so she came to the emergency room. She denies any trouble with incontinence. No difficulty walking Past Medical History:  Diagnosis Date  . Bilateral ovarian cysts   . DUB (dysfunctional uterine bleeding)   . Fibroid, uterus     Patient Active Problem List   Diagnosis Date Noted  . Routine gynecological examination 03/29/2012  . Hypertension 01/29/2011    Past Surgical History:  Procedure Laterality Date  . TUBAL LIGATION  2001    OB History    Gravida Para Term Preterm AB Living   2 2 2     2    SAB TAB Ectopic Multiple Live Births                   Home Medications    Prior to Admission medications   Medication Sig Start Date End Date Taking? Authorizing Provider  acetaminophen (TYLENOL) 500 MG tablet Take 1,000 mg by mouth every 6 (six) hours as needed for mild pain.   Yes Historical Provider, MD  Vitamin D, Ergocalciferol, (DRISDOL) 50000 units CAPS capsule Take 50,000 Units by mouth every 7 (seven) days. On Saturday   Yes Historical Provider, MD  cyclobenzaprine (FLEXERIL) 10 MG  tablet Take 1 tablet (10 mg total) by mouth 2 (two) times daily as needed for muscle spasms. Patient not taking: Reported on 10/03/2015 08/23/15   Ripley Fraise, MD  etodolac (LODINE) 300 MG capsule Take 1 capsule (300 mg total) by mouth every 8 (eight) hours. 10/03/15   Dorie Rank, MD    Family History Family History  Problem Relation Age of Onset  . Diabetes Mother   . Heart disease Mother   . Hypertension Mother   . Hyperthyroidism Brother   . Hypertension Brother   . Cancer Maternal Aunt     Social History Social History  Substance Use Topics  . Smoking status: Former Smoker    Types: Cigarettes    Quit date: 01/28/1997  . Smokeless tobacco: Never Used  . Alcohol use Yes     Comment: social     Allergies   Review of patient's allergies indicates no known allergies.   Review of Systems Review of Systems  All other systems reviewed and are negative.    Physical Exam Updated Vital Signs BP 141/88 (BP Location: Left Arm)   Pulse 70   Temp 99.1 F (37.3 C) (Oral)   Resp 18   Ht 5\' 5"  (1.651 m)   Wt 89.4 kg   SpO2 100%   BMI 32.78 kg/m   Physical  Exam  Constitutional: She appears well-developed and well-nourished. No distress.  HENT:  Head: Normocephalic and atraumatic.  Right Ear: External ear normal.  Left Ear: External ear normal.  Nose: Nose normal.  Eyes: Conjunctivae and EOM are normal. Right eye exhibits no discharge. Left eye exhibits no discharge. No scleral icterus.  Neck: Neck supple. No tracheal deviation present.  Cardiovascular: Normal rate.   Pulmonary/Chest: Effort normal. No stridor. No respiratory distress.  Abdominal: She exhibits no distension.  Musculoskeletal: She exhibits no edema or tenderness.       Lumbar back: She exhibits decreased range of motion, pain and spasm. She exhibits no swelling and no edema.  Neurological: She is alert. She is not disoriented. No sensory deficit. Cranial nerve deficit: no gross deficits. She exhibits  normal muscle tone. Coordination normal.  Reflex Scores:      Patellar reflexes are 2+ on the right side and 2+ on the left side.      Achilles reflexes are 2+ on the right side and 2+ on the left side. Skin: Skin is warm and dry. No rash noted. She is not diaphoretic. No erythema.  Psychiatric: She has a normal mood and affect. Her behavior is normal. Thought content normal.  Nursing note and vitals reviewed.    ED Treatments / Results    Radiology Dg Hip Unilat W Or Wo Pelvis 2-3 Views Left  Result Date: 10/03/2015 CLINICAL DATA:  Left hip pain for several months. Worsening over the past few days with development of left leg pain. EXAM: DG HIP (WITH OR WITHOUT PELVIS) 2-3V LEFT COMPARISON:  None. FINDINGS: Both hips are normally located. No acute bony findings, degenerative changes or plain film evidence of AVN. The pubic symphysis and SI joints are intact. No pelvic fractures or bone lesions. IMPRESSION: Normal radiographs of the pelvis and left hip. Electronically Signed   By: Marijo Sanes M.D.   On: 10/03/2015 23:15    Procedures Procedures (including critical care time)  Medications Ordered in ED Medications - No data to display   Initial Impression / Assessment and Plan / ED Course  I have reviewed the triage vital signs and the nursing notes.  Pertinent labs & imaging results that were available during my care of the patient were reviewed by me and considered in my medical decision making (see chart for details).  Clinical Course  Comment By Time  Hip films are normal.  Suspect her sx are radicular in nature Dorie Rank, MD 09/20 2324    I reviewed the patient's MRI results. She does have spinal stenosis and areas of nerve root impingement. I suspect her symptoms are related to lumbar radiculopathy.  Final Clinical Impressions(s) / ED Diagnoses   Final diagnoses:  Left-sided low back pain with left-sided sciatica    New Prescriptions New Prescriptions   ETODOLAC  (LODINE) 300 MG CAPSULE    Take 1 capsule (300 mg total) by mouth every 8 (eight) hours.     Dorie Rank, MD 10/03/15 [phone]

## 2015-10-03 NOTE — ED Triage Notes (Signed)
Patient complaining of left-sided lower back/hip pain radiating down her left leg x1 month. Patient was seen here a few weeks ago and referred to a neurologist. Neurologist only checked her back and did not check her left hip. Patient now has a tingling sensation from her left knee to her toes. Patient is ambulatory to triage.

## 2015-11-19 ENCOUNTER — Ambulatory Visit (INDEPENDENT_AMBULATORY_CARE_PROVIDER_SITE_OTHER): Payer: Self-pay | Admitting: Sports Medicine

## 2016-02-06 ENCOUNTER — Other Ambulatory Visit: Payer: Self-pay | Admitting: Internal Medicine

## 2016-02-06 DIAGNOSIS — Z1231 Encounter for screening mammogram for malignant neoplasm of breast: Secondary | ICD-10-CM

## 2016-02-19 ENCOUNTER — Ambulatory Visit: Payer: Self-pay | Admitting: Podiatry

## 2016-03-07 ENCOUNTER — Ambulatory Visit: Payer: BLUE CROSS/BLUE SHIELD | Admitting: Obstetrics & Gynecology

## 2016-05-26 ENCOUNTER — Ambulatory Visit: Payer: BLUE CROSS/BLUE SHIELD
